# Patient Record
Sex: Male | Born: 1965 | Hispanic: No | Marital: Single | Smoking: Current every day smoker
Health system: Southern US, Community
[De-identification: ages and names within clinical notes are randomized; demographics above are authoritative.]

## PROBLEM LIST (undated history)

## (undated) DIAGNOSIS — I219 Acute myocardial infarction, unspecified: Secondary | ICD-10-CM

## (undated) DIAGNOSIS — I469 Cardiac arrest, cause unspecified: Secondary | ICD-10-CM

---

## 2017-11-02 ENCOUNTER — Encounter (HOSPITAL_COMMUNITY): Payer: Self-pay

## 2017-11-02 ENCOUNTER — Other Ambulatory Visit: Payer: Self-pay

## 2017-11-02 ENCOUNTER — Inpatient Hospital Stay (HOSPITAL_COMMUNITY): Payer: Medicaid Other

## 2017-11-02 ENCOUNTER — Inpatient Hospital Stay (HOSPITAL_COMMUNITY)
Admission: EM | Admit: 2017-11-02 | Discharge: 2017-11-20 | DRG: 270 | Disposition: E | Payer: Medicaid Other | Attending: Critical Care Medicine | Admitting: Critical Care Medicine

## 2017-11-02 ENCOUNTER — Inpatient Hospital Stay (HOSPITAL_COMMUNITY): Admission: EM | Disposition: E | Payer: Self-pay | Source: Home / Self Care | Attending: Critical Care Medicine

## 2017-11-02 DIAGNOSIS — I472 Ventricular tachycardia: Secondary | ICD-10-CM | POA: Diagnosis present

## 2017-11-02 DIAGNOSIS — N170 Acute kidney failure with tubular necrosis: Secondary | ICD-10-CM | POA: Diagnosis present

## 2017-11-02 DIAGNOSIS — F1290 Cannabis use, unspecified, uncomplicated: Secondary | ICD-10-CM | POA: Diagnosis present

## 2017-11-02 DIAGNOSIS — R0489 Hemorrhage from other sites in respiratory passages: Secondary | ICD-10-CM | POA: Diagnosis present

## 2017-11-02 DIAGNOSIS — I4901 Ventricular fibrillation: Secondary | ICD-10-CM | POA: Diagnosis present

## 2017-11-02 DIAGNOSIS — J9602 Acute respiratory failure with hypercapnia: Secondary | ICD-10-CM | POA: Diagnosis present

## 2017-11-02 DIAGNOSIS — J9601 Acute respiratory failure with hypoxia: Secondary | ICD-10-CM | POA: Diagnosis present

## 2017-11-02 DIAGNOSIS — R570 Cardiogenic shock: Secondary | ICD-10-CM | POA: Diagnosis present

## 2017-11-02 DIAGNOSIS — F17210 Nicotine dependence, cigarettes, uncomplicated: Secondary | ICD-10-CM | POA: Diagnosis present

## 2017-11-02 DIAGNOSIS — Z0189 Encounter for other specified special examinations: Secondary | ICD-10-CM

## 2017-11-02 DIAGNOSIS — E876 Hypokalemia: Secondary | ICD-10-CM | POA: Diagnosis present

## 2017-11-02 DIAGNOSIS — Z515 Encounter for palliative care: Secondary | ICD-10-CM | POA: Diagnosis present

## 2017-11-02 DIAGNOSIS — Z978 Presence of other specified devices: Secondary | ICD-10-CM

## 2017-11-02 DIAGNOSIS — Z9889 Other specified postprocedural states: Secondary | ICD-10-CM

## 2017-11-02 DIAGNOSIS — I959 Hypotension, unspecified: Secondary | ICD-10-CM | POA: Diagnosis not present

## 2017-11-02 DIAGNOSIS — G253 Myoclonus: Secondary | ICD-10-CM | POA: Diagnosis present

## 2017-11-02 DIAGNOSIS — J939 Pneumothorax, unspecified: Secondary | ICD-10-CM | POA: Diagnosis present

## 2017-11-02 DIAGNOSIS — D696 Thrombocytopenia, unspecified: Secondary | ICD-10-CM | POA: Diagnosis present

## 2017-11-02 DIAGNOSIS — R569 Unspecified convulsions: Secondary | ICD-10-CM

## 2017-11-02 DIAGNOSIS — I2111 ST elevation (STEMI) myocardial infarction involving right coronary artery: Secondary | ICD-10-CM | POA: Diagnosis not present

## 2017-11-02 DIAGNOSIS — N179 Acute kidney failure, unspecified: Secondary | ICD-10-CM | POA: Diagnosis not present

## 2017-11-02 DIAGNOSIS — R4189 Other symptoms and signs involving cognitive functions and awareness: Secondary | ICD-10-CM | POA: Diagnosis not present

## 2017-11-02 DIAGNOSIS — G9382 Brain death: Secondary | ICD-10-CM | POA: Diagnosis not present

## 2017-11-02 DIAGNOSIS — G931 Anoxic brain damage, not elsewhere classified: Secondary | ICD-10-CM | POA: Diagnosis present

## 2017-11-02 DIAGNOSIS — I2119 ST elevation (STEMI) myocardial infarction involving other coronary artery of inferior wall: Secondary | ICD-10-CM | POA: Diagnosis present

## 2017-11-02 DIAGNOSIS — Z955 Presence of coronary angioplasty implant and graft: Secondary | ICD-10-CM | POA: Diagnosis not present

## 2017-11-02 DIAGNOSIS — I469 Cardiac arrest, cause unspecified: Secondary | ICD-10-CM | POA: Diagnosis not present

## 2017-11-02 DIAGNOSIS — E874 Mixed disorder of acid-base balance: Secondary | ICD-10-CM | POA: Diagnosis present

## 2017-11-02 DIAGNOSIS — J9600 Acute respiratory failure, unspecified whether with hypoxia or hypercapnia: Secondary | ICD-10-CM

## 2017-11-02 DIAGNOSIS — I2510 Atherosclerotic heart disease of native coronary artery without angina pectoris: Secondary | ICD-10-CM | POA: Diagnosis present

## 2017-11-02 DIAGNOSIS — K7200 Acute and subacute hepatic failure without coma: Secondary | ICD-10-CM | POA: Diagnosis present

## 2017-11-02 DIAGNOSIS — E872 Acidosis, unspecified: Secondary | ICD-10-CM | POA: Clinically undetermined

## 2017-11-02 DIAGNOSIS — L8990 Pressure ulcer of unspecified site, unspecified stage: Secondary | ICD-10-CM

## 2017-11-02 DIAGNOSIS — Z66 Do not resuscitate: Secondary | ICD-10-CM | POA: Diagnosis present

## 2017-11-02 DIAGNOSIS — J690 Pneumonitis due to inhalation of food and vomit: Secondary | ICD-10-CM | POA: Diagnosis present

## 2017-11-02 DIAGNOSIS — R739 Hyperglycemia, unspecified: Secondary | ICD-10-CM | POA: Diagnosis not present

## 2017-11-02 DIAGNOSIS — Z4682 Encounter for fitting and adjustment of non-vascular catheter: Secondary | ICD-10-CM

## 2017-11-02 DIAGNOSIS — I219 Acute myocardial infarction, unspecified: Secondary | ICD-10-CM

## 2017-11-02 HISTORY — DX: Cardiac arrest, cause unspecified: I46.9

## 2017-11-02 HISTORY — DX: Acute myocardial infarction, unspecified: I21.9

## 2017-11-02 HISTORY — PX: CORONARY/GRAFT ACUTE MI REVASCULARIZATION: CATH118305

## 2017-11-02 HISTORY — PX: RIGHT HEART CATH: CATH118263

## 2017-11-02 HISTORY — PX: IABP INSERTION: CATH118242

## 2017-11-02 HISTORY — PX: LEFT HEART CATH AND CORONARY ANGIOGRAPHY: CATH118249

## 2017-11-02 LAB — PROTIME-INR
INR: 1.45
PROTHROMBIN TIME: 17.5 s — AB (ref 11.4–15.2)

## 2017-11-02 LAB — RAPID URINE DRUG SCREEN, HOSP PERFORMED
Amphetamines: NOT DETECTED
BARBITURATES: NOT DETECTED
Benzodiazepines: NOT DETECTED
Cocaine: NOT DETECTED
Opiates: NOT DETECTED
TETRAHYDROCANNABINOL: POSITIVE — AB

## 2017-11-02 LAB — URINALYSIS, ROUTINE W REFLEX MICROSCOPIC
Bilirubin Urine: NEGATIVE
Glucose, UA: 50 mg/dL — AB
KETONES UR: 5 mg/dL — AB
Leukocytes, UA: NEGATIVE
NITRITE: NEGATIVE
PH: 5 (ref 5.0–8.0)
Protein, ur: >=300 mg/dL — AB

## 2017-11-02 LAB — CBC
HCT: 44.8 % (ref 39.0–52.0)
Hemoglobin: 14.9 g/dL (ref 13.0–17.0)
MCH: 29.3 pg (ref 26.0–34.0)
MCHC: 33.3 g/dL (ref 30.0–36.0)
MCV: 88 fL (ref 78.0–100.0)
Platelets: 228 10*3/uL (ref 150–400)
RBC: 5.09 MIL/uL (ref 4.22–5.81)
RDW: 12.6 % (ref 11.5–15.5)
WBC: 33.5 10*3/uL — ABNORMAL HIGH (ref 4.0–10.5)

## 2017-11-02 LAB — LIPID PANEL
Cholesterol: 104 mg/dL (ref 0–200)
HDL: 44 mg/dL (ref >40)
LDL CALC: 42 mg/dL (ref 0–99)
Total CHOL/HDL Ratio: 2.4 RATIO
Triglycerides: 89 mg/dL (ref <150)
VLDL: 18 mg/dL (ref 0–40)

## 2017-11-02 LAB — COMPREHENSIVE METABOLIC PANEL
ALBUMIN: 3.3 g/dL — AB (ref 3.5–5.0)
ALK PHOS: 67 U/L (ref 38–126)
ALT: 134 U/L — AB (ref 17–63)
ANION GAP: 21 — AB (ref 5–15)
AST: 294 U/L — ABNORMAL HIGH (ref 15–41)
BUN: 14 mg/dL (ref 6–20)
CALCIUM: 7.7 mg/dL — AB (ref 8.9–10.3)
CHLORIDE: 103 mmol/L (ref 101–111)
CO2: 13 mmol/L — AB (ref 22–32)
Creatinine, Ser: 1.65 mg/dL — ABNORMAL HIGH (ref 0.61–1.24)
GFR calc Af Amer: 54 mL/min — ABNORMAL LOW (ref >60)
GFR calc non Af Amer: 47 mL/min — ABNORMAL LOW (ref >60)
GLUCOSE: 388 mg/dL — AB (ref 65–99)
Potassium: 3.5 mmol/L (ref 3.5–5.1)
SODIUM: 137 mmol/L (ref 135–145)
Total Bilirubin: 1.5 mg/dL — ABNORMAL HIGH (ref 0.3–1.2)
Total Protein: 5.4 g/dL — ABNORMAL LOW (ref 6.5–8.1)

## 2017-11-02 LAB — HEMOGLOBIN A1C
HEMOGLOBIN A1C: 5.5 % (ref 4.8–5.6)
Mean Plasma Glucose: 111.15 mg/dL

## 2017-11-02 LAB — GLUCOSE, CAPILLARY: Glucose-Capillary: 238 mg/dL — ABNORMAL HIGH (ref 65–99)

## 2017-11-02 LAB — TROPONIN I: TROPONIN I: 39.49 ng/mL — AB (ref <0.03)

## 2017-11-02 LAB — APTT: aPTT: 35 seconds (ref 24–36)

## 2017-11-02 SURGERY — CORONARY/GRAFT ACUTE MI REVASCULARIZATION
Anesthesia: LOCAL

## 2017-11-02 MED ORDER — HEPARIN (PORCINE) IN NACL 1000-0.9 UT/500ML-% IV SOLN
INTRAVENOUS | Status: AC
  Filled 2017-11-02: qty 1000

## 2017-11-02 MED ORDER — INSULIN ASPART 100 UNIT/ML ~~LOC~~ SOLN
2.0000 [IU] | SUBCUTANEOUS | Status: DC
  Administered 2017-11-03: 4 [IU] via SUBCUTANEOUS
  Administered 2017-11-03 (×2): 6 [IU] via SUBCUTANEOUS
  Administered 2017-11-03: 4 [IU] via SUBCUTANEOUS
  Administered 2017-11-04 – 2017-11-05 (×3): 2 [IU] via SUBCUTANEOUS

## 2017-11-02 MED ORDER — SODIUM CHLORIDE 0.9 % IV SOLN
250.0000 mL | INTRAVENOUS | Status: DC | PRN
Start: 2017-11-02 — End: 2017-11-06

## 2017-11-02 MED ORDER — HEPARIN (PORCINE) IN NACL 2-0.9 UNITS/ML
INTRAMUSCULAR | Status: AC | PRN
  Administered 2017-11-02 (×3): 500 mL

## 2017-11-02 MED ORDER — METOPROLOL TARTRATE 12.5 MG HALF TABLET
12.5000 mg | ORAL_TABLET | Freq: Two times a day (BID) | ORAL | Status: DC
  Administered 2017-11-05: 12.5 mg via ORAL
  Filled 2017-11-02 (×2): qty 1

## 2017-11-02 MED ORDER — SODIUM CHLORIDE 0.9% FLUSH
3.0000 mL | Freq: Two times a day (BID) | INTRAVENOUS | Status: DC
  Administered 2017-11-03 – 2017-11-05 (×7): 3 mL via INTRAVENOUS

## 2017-11-02 MED ORDER — BIVALIRUDIN TRIFLUOROACETATE 250 MG IV SOLR
INTRAVENOUS | Status: AC
  Filled 2017-11-02: qty 250

## 2017-11-02 MED ORDER — HYDRALAZINE HCL 20 MG/ML IJ SOLN: 5.0000 mg | INTRAMUSCULAR | Status: AC | PRN

## 2017-11-02 MED ORDER — ONDANSETRON HCL 4 MG/2ML IJ SOLN
4.0000 mg | Freq: Four times a day (QID) | INTRAMUSCULAR | Status: DC | PRN
Start: 2017-11-02 — End: 2017-11-06

## 2017-11-02 MED ORDER — TICAGRELOR 90 MG PO TABS
90.0000 mg | ORAL_TABLET | Freq: Two times a day (BID) | ORAL | Status: DC
  Administered 2017-11-02 – 2017-11-05 (×8): 90 mg via ORAL
  Filled 2017-11-02 (×7): qty 1

## 2017-11-02 MED ORDER — ATORVASTATIN CALCIUM 80 MG PO TABS: 80.0000 mg | ORAL_TABLET | Freq: Every day | ORAL | Status: DC

## 2017-11-02 MED ORDER — LIDOCAINE HCL (PF) 1 % IJ SOLN
INTRAMUSCULAR | Status: AC
  Filled 2017-11-02: qty 5

## 2017-11-02 MED ORDER — SODIUM CHLORIDE 0.9 % IV SOLN
INTRAVENOUS | Status: AC | PRN
  Administered 2017-11-02: 4 ug/kg/min via INTRAVENOUS

## 2017-11-02 MED ORDER — HEPARIN SODIUM (PORCINE) 1000 UNIT/ML IJ SOLN
INTRAMUSCULAR | Status: AC
  Filled 2017-11-02: qty 1

## 2017-11-02 MED ORDER — SODIUM CHLORIDE 0.9 % IV SOLN
INTRAVENOUS | Status: DC | PRN
  Administered 2017-11-02: 1.75 mg/kg/h via INTRAVENOUS

## 2017-11-02 MED ORDER — FENTANYL CITRATE (PF) 100 MCG/2ML IJ SOLN: 100.0000 ug | INTRAMUSCULAR | Status: DC | PRN

## 2017-11-02 MED ORDER — FENTANYL CITRATE (PF) 100 MCG/2ML IJ SOLN
100.0000 ug | INTRAMUSCULAR | Status: DC | PRN
  Administered 2017-11-03 – 2017-11-04 (×2): 100 ug via INTRAVENOUS
  Filled 2017-11-02 (×3): qty 2

## 2017-11-02 MED ORDER — AMIODARONE HCL IN DEXTROSE 360-4.14 MG/200ML-% IV SOLN
30.0000 mg/h | INTRAVENOUS | Status: DC
  Administered 2017-11-03 – 2017-11-05 (×6): 30 mg/h via INTRAVENOUS
  Filled 2017-11-02 (×6): qty 200

## 2017-11-02 MED ORDER — SODIUM BICARBONATE 8.4 % IV SOLN
100.0000 meq | Freq: Once | INTRAVENOUS | Status: AC
  Administered 2017-11-02: 100 meq via INTRAVENOUS
  Filled 2017-11-02: qty 100

## 2017-11-02 MED ORDER — IOHEXOL 350 MG/ML SOLN
INTRAVENOUS | Status: DC | PRN
  Administered 2017-11-02: 120 mL via INTRA_ARTERIAL

## 2017-11-02 MED ORDER — AMIODARONE HCL IN DEXTROSE 360-4.14 MG/200ML-% IV SOLN
INTRAVENOUS | Status: AC | PRN
  Administered 2017-11-02: 60 mg/h via INTRAVENOUS

## 2017-11-02 MED ORDER — BIVALIRUDIN BOLUS VIA INFUSION - CUPID
INTRAVENOUS | Status: DC | PRN
  Administered 2017-11-02: 48.75 mg via INTRAVENOUS

## 2017-11-02 MED ORDER — AMIODARONE HCL IN DEXTROSE 360-4.14 MG/200ML-% IV SOLN
60.0000 mg/h | INTRAVENOUS | Status: AC
  Administered 2017-11-03: 60 mg/h via INTRAVENOUS

## 2017-11-02 MED ORDER — ACETAMINOPHEN 325 MG PO TABS: 650.0000 mg | ORAL_TABLET | ORAL | Status: DC | PRN

## 2017-11-02 MED ORDER — PIPERACILLIN-TAZOBACTAM 3.375 G IVPB
3.3750 g | Freq: Three times a day (TID) | INTRAVENOUS | Status: DC
Start: 2017-11-02 — End: 2017-11-04
  Administered 2017-11-02 – 2017-11-04 (×6): 3.375 g via INTRAVENOUS
  Filled 2017-11-02 (×7): qty 50

## 2017-11-02 MED ORDER — SODIUM CHLORIDE 0.9 % IV SOLN
INTRAVENOUS | Status: DC
  Administered 2017-11-02: 23:00:00 via INTRAVENOUS

## 2017-11-02 MED ORDER — LABETALOL HCL 5 MG/ML IV SOLN: 10.0000 mg | INTRAVENOUS | Status: AC | PRN

## 2017-11-02 MED ORDER — ADENOSINE (DIAGNOSTIC) FOR INTRACORONARY USE
INTRAVENOUS | Status: DC | PRN
  Administered 2017-11-02 (×2): 60 ug via INTRACORONARY

## 2017-11-02 MED ORDER — POTASSIUM CHLORIDE 10 MEQ/100ML IV SOLN
10.0000 meq | INTRAVENOUS | Status: AC
  Administered 2017-11-02 – 2017-11-03 (×5): 10 meq via INTRAVENOUS
  Filled 2017-11-02 (×3): qty 100

## 2017-11-02 MED ORDER — CANGRELOR TETRASODIUM 50 MG IV SOLR
INTRAVENOUS | Status: AC
  Filled 2017-11-02: qty 50

## 2017-11-02 MED ORDER — VERAPAMIL HCL 2.5 MG/ML IV SOLN
INTRAVENOUS | Status: AC
Start: 2017-11-02 — End: ?
  Filled 2017-11-02: qty 2

## 2017-11-02 MED ORDER — SODIUM CHLORIDE 0.9 % IV SOLN
250.0000 mL | INTRAVENOUS | Status: DC | PRN
  Administered 2017-11-04: 10 mL via INTRAVENOUS

## 2017-11-02 MED ORDER — SODIUM CHLORIDE 0.9 % IV SOLN
2000.0000 mg | INTRAVENOUS | Status: AC
  Administered 2017-11-02: 2000 mg via INTRAVENOUS
  Filled 2017-11-02: qty 20

## 2017-11-02 MED ORDER — SODIUM CHLORIDE 0.9% FLUSH: 3.0000 mL | INTRAVENOUS | Status: DC | PRN

## 2017-11-02 MED ORDER — ASPIRIN 81 MG PO CHEW
81.0000 mg | CHEWABLE_TABLET | Freq: Every day | ORAL | Status: DC
  Administered 2017-11-03 – 2017-11-05 (×3): 81 mg via ORAL
  Filled 2017-11-02 (×3): qty 1

## 2017-11-02 MED ORDER — LIDOCAINE HCL (PF) 1 % IJ SOLN
INTRAMUSCULAR | Status: DC | PRN
  Administered 2017-11-02: 15 mL

## 2017-11-02 MED ORDER — FAMOTIDINE IN NACL 20-0.9 MG/50ML-% IV SOLN
20.0000 mg | Freq: Two times a day (BID) | INTRAVENOUS | Status: DC
  Administered 2017-11-02 – 2017-11-04 (×4): 20 mg via INTRAVENOUS
  Filled 2017-11-02 (×5): qty 50

## 2017-11-02 MED ORDER — CANGRELOR BOLUS VIA INFUSION
INTRAVENOUS | Status: DC | PRN
  Administered 2017-11-02: 1950 ug via INTRAVENOUS

## 2017-11-02 MED ORDER — NITROGLYCERIN 1 MG/10 ML FOR IR/CATH LAB
INTRA_ARTERIAL | Status: DC | PRN
  Administered 2017-11-02: 200 ug via INTRACORONARY

## 2017-11-02 MED ORDER — PROPOFOL 1000 MG/100ML IV EMUL
0.0000 ug/kg/min | INTRAVENOUS | Status: DC
  Administered 2017-11-02: 10 ug/kg/min via INTRAVENOUS
  Administered 2017-11-03 – 2017-11-04 (×2): 30 ug/kg/min via INTRAVENOUS
  Filled 2017-11-02 (×4): qty 100

## 2017-11-02 MED ORDER — SODIUM CHLORIDE 0.9 % IV SOLN
4.0000 ug/kg/min | INTRAVENOUS | Status: DC
  Administered 2017-11-02 – 2017-11-03 (×2): 4 ug/kg/min via INTRAVENOUS
  Filled 2017-11-02 (×2): qty 50

## 2017-11-02 MED ORDER — NOREPINEPHRINE BITARTRATE 1 MG/ML IV SOLN
0.0000 ug/min | INTRAVENOUS | Status: DC
  Administered 2017-11-02: 8 ug/min via INTRAVENOUS
  Filled 2017-11-02: qty 4

## 2017-11-02 MED ORDER — PROPOFOL 10 MG/ML IV BOLUS
INTRAVENOUS | Status: DC | PRN
  Administered 2017-11-02: 650 ug via INTRAVENOUS

## 2017-11-02 MED ORDER — LIDOCAINE HCL (PF) 1 % IJ SOLN
INTRAMUSCULAR | Status: AC
  Filled 2017-11-02: qty 30

## 2017-11-02 MED FILL — Medication: Qty: 1 | Status: AC

## 2017-11-02 SURGICAL SUPPLY — 25 items
BALLN IABP SENSA PLUS 8F 50CC (BALLOONS) ×2
BALLN SAPPHIRE 2.5X15 (BALLOONS) ×2
BALLOON IABP SENS PLUS 8F 50CC (BALLOONS) ×1 IMPLANT
BALLOON SAPPHIRE 2.5X15 (BALLOONS) ×1 IMPLANT
CATH EXTRAC PRONTO 5.5F 138CM (CATHETERS) ×2 IMPLANT
CATH INFINITI 5FR MULTPACK ANG (CATHETERS) ×2 IMPLANT
CATH LAUNCHER 6FR JR4 (CATHETERS) ×2 IMPLANT
CATH SWAN GANZ 7F STRAIGHT (CATHETERS) ×2 IMPLANT
GLIDESHEATH SLEND SS 6F .021 (SHEATH) ×2 IMPLANT
GUIDEWIRE INQWIRE 1.5J.035X260 (WIRE) ×1 IMPLANT
INQWIRE 1.5J .035X260CM (WIRE) ×2
KIT ENCORE 26 ADVANTAGE (KITS) ×2 IMPLANT
KIT HEART LEFT (KITS) ×2 IMPLANT
KIT HEMO VALVE WATCHDOG (MISCELLANEOUS) ×2 IMPLANT
PACK CARDIAC CATHETERIZATION (CUSTOM PROCEDURE TRAY) ×2 IMPLANT
SHEATH AVANTI 11CM 7FR (SHEATH) ×2 IMPLANT
SHEATH PINNACLE 6F 10CM (SHEATH) ×2 IMPLANT
SLEEVE REPOSITIONING LENGTH 30 (MISCELLANEOUS) ×2 IMPLANT
STENT SYNERGY DES 3.5X38 (Permanent Stent) ×2 IMPLANT
STENT SYNERGY DES 3X38 (Permanent Stent) ×2 IMPLANT
TRANSDUCER W/STOPCOCK (MISCELLANEOUS) ×4 IMPLANT
TUBING CIL FLEX 10 FLL-RA (TUBING) ×2 IMPLANT
WIRE ASAHI PROWATER 180CM (WIRE) ×2 IMPLANT
WIRE EMERALD 3MM-J .035X150CM (WIRE) ×2 IMPLANT
WIRE FIGHTER CROSSING 190CM (WIRE) ×2 IMPLANT

## 2017-11-02 NOTE — Code Documentation (Signed)
Pulses back, ST elevation noticed on EKG. Amiodarone and Propofol ordered by MD

## 2017-11-02 NOTE — ED Provider Notes (Signed)
[facility] EMERGENCY DEPARTMENT Provider Note   CSN: [id number] Arrival date & time: 11/07/2017  1828     History   Chief Complaint Chief Complaint  Patient presents with  . Cardiac Arrest    HPI Scott Mcclure is a 52 y.o. male.  5 caveat secondary to acuity condition.  About an hour prior to arrival patient experienced substernal chest pain and then collapsed at home.  By standard CPR begun.  Fire department shocked multiple times and began CPR with [name].  EMS gave him epi and amiodarone bolus for V. tach V. fib.  Patient had ROSC during transport and prehospital EKG demonstrates a large ST elevation inferior MI with reciprocal depression.  But 2 minutes prior to arrival at [facility] patient went back into V. tach and lost pulses and CPR was resumed.  Patient arrives with CPR ongoing.  HPI  History reviewed. No pertinent past medical history.  There are no active problems to display for this patient.   History reviewed. No pertinent surgical history.      Home Medications    Prior to Admission medications   Not on File    Family History History reviewed. No pertinent family history.  Social History Social History   Tobacco Use  . Smoking status: Not on file  Substance Use Topics  . Alcohol use: Not on file  . Drug use: Not on file     Allergies   Patient has no known allergies.   Review of Systems Review of Systems  Unable to perform ROS: Acuity of condition     Physical Exam Updated Vital Signs BP (!) 112/99   Pulse 88   Temp (!) 97.2 F (36.2 C) (Temporal)   Resp (!) 23   Wt 65 kg (143 lb 4.8 oz)   SpO2 99%   Physical Exam  Constitutional: He appears well-developed and well-nourished.  HENT:  Head: Normocephalic and atraumatic.  Right Ear: External ear normal.  Left Ear: External ear normal.  Orally intubated blood out of the ET tube.  Eyes:  Pupils mid and sluggish.  Equal.  Neck: No tracheal deviation present.    Cardiovascular:  [name] was taken off and patient has femoral pulses and by ultrasound has cardiac activity.  Pulmonary/Chest:  Patient is spontaneously breathing and being assisted by ambulance.  Full breath sounds bilaterally.  There is a anterior chest central defects as a pectus excavatum possibly related to CPR.  Abdominal: Soft. He exhibits no mass.  Genitourinary: Penis normal.  Musculoskeletal: Normal range of motion. He exhibits no deformity.  Neurological:  Patient is unresponsive but having spontaneous breathing.  He is moving his arms reaching for the ET tube.     ED Treatments / Results  Labs (all labs ordered are listed, but only abnormal results are displayed) Labs Reviewed  COMPREHENSIVE METABOLIC PANEL  LIPID PANEL  HEMOGLOBIN A1C  CBC  PROTIME-INR  APTT  TROPONIN I    EKG EKG Interpretation  Date/Time:  Tuesday 11-07-2017 18:29:14 EDT Ventricular Rate:  94 PR Interval:    QRS Duration: 165 QT Interval:  402 QTC Calculation: 503 R Axis:   60 Text Interpretation:  Sinus rhythm Nonspecific intraventricular conduction delay Probable inferior infarct, recent Abnrm T, consider ischemia, anterolateral lds Baseline wander in lead(s) I III aVL no prior available Confirmed by Meridee Score [phone]) on 07-Nov-2017 6:47:55 PM   Radiology No results found.  Procedures .Critical Care Performed by: Terrilee Files, MD Authorized by:  Terrilee Files, MD   Critical care provider statement:    Critical care time (minutes):  30   Critical care time was exclusive of:  Separately billable procedures and treating other patients   Critical care was necessary to treat or prevent imminent or life-threatening deterioration of the following conditions:  Cardiac failure   Critical care was time spent personally by me on the following activities:  Discussions with consultants, evaluation of patient's response to treatment, obtaining history from patient or surrogate,  examination of patient, ordering and performing treatments and interventions, pulse oximetry, re-evaluation of patient's condition and ventilator management   I assumed direction of critical care for this patient from another provider in my specialty: no   Comments:     Patient was in ED for 20+ minutes requiring my bedside presence as a peri-arrest unstable patient. Discussing with cardiologist and transfer to cath lab, documentation.    (including critical care time)  Medications Ordered in ED Medications - No data to display   Initial Impression / Assessment and Plan / ED Course  I have reviewed the triage vital signs and the nursing notes.  Pertinent labs & imaging results that were available during my care of the patient were reviewed by me and considered in my medical decision making (see chart for details).  Clinical Course as of Nov 03 1233  Tue Nov 02, 2017  1610 Patient with witnessed cardiac arrest and now has ongoing CPR.  On arrival the stop the Casselman and by cardiac ultrasound has cardiac movement and has peripheral pulses.  Interventional cardiology is here and asking that we get the patient up to the Cath Lab.  We are starting him on an amnio drip and propofol for sedation.  There is been some blood from the ET tube.  He still getting adequate volumes and with the acuity of the situation getting up to the Cath Lab was taken priority over any further manipulation of the tube.   [MB]    Clinical Course User Index [MB] Terrilee Files, MD     Final Clinical Impressions(s) / ED Diagnoses   Final diagnoses:  Cardiopulmonary arrest [facility])    ED Discharge Orders    None       Terrilee Files, MD 11/03/17 1240

## 2017-11-02 NOTE — ED Triage Notes (Signed)
Patient here by EMS for activated STEMI, patient collapsed in front of family.  Bystander CPR preformed.  Total of 40 min CPR given by EMS.  ROSC for 25 min then went into [facility]  Shocked total of 4 times by EMS. Initial rhythm VFIB. 6 of epi given 300 mg amiodarone IV push given by EMS.  CPR ongoing on arrival to ED.    CBG 305, last BP: 128/88, HR 100, intubated by EMS, sats 100%

## 2017-11-02 NOTE — ED Notes (Signed)
Several more family members taken to Consultation B.

## 2017-11-02 NOTE — H&P (Signed)
Cardiology Consultation:   Patient ID: Scott Mcclure; [id number]; [birth year]   Admit date: 2017-11-22 Date of Consult: Nov 22, 2017  Primary Care Provider: No primary care provider on file. Primary Cardiologist: No primary care provider on file. Scott Mcclure -new Primary Electrophysiologist:     Patient Profile:   Scott Mcclure is a 52 y.o. male with a hx of  who is being seen today for the evaluation of cardiac arrest at the request of [name], Dr. Charm Barges.  History of Present Illness:   Scott Mcclure hasd chest pain at home and collapsed in front of his family.  He required 4 shocks and approximately 40 minutes of CPR.  ECG showed inferior ST elevations so he is brought emergently to the cath lab.    History obtained from the EMS staff.  Patient intubated and unable to give history.  Amio drip started in the ER.   ER records show: "About an hour prior to arrival patient experienced substernal chest pain and then collapsed at home.  By standard CPR begun.  Fire department shocked multiple times and began CPR with Scott Mcclure.  EMS gave him epi and amiodarone bolus for V. tach V. fib.  Patient had ROSC during transport and prehospital EKG demonstrates a large ST elevation inferior MI with reciprocal depression.  But 2 minutes prior to arrival at [facility] patient went back into V. tach and lost pulses and CPR was resumed.  Patient arrives with CPR ongoing."    History reviewed. No pertinent past medical history.  History reviewed. No pertinent surgical history.     Inpatient Medications: Scheduled Meds:  Continuous Infusions:  PRN Meds:   Allergies:   No Known Allergies  Social History:   Social History   Socioeconomic History  . Marital status: Not on file    Spouse name: Not on file  . Number of children: Not on file  . Years of education: Not on file  . Highest education level: Not on file  Occupational History  . Not on file  Social Needs  . Financial resource strain: Not on file    . Food insecurity:    Worry: Not on file    Inability: Not on file  . Transportation needs:    Medical: Not on file    Non-medical: Not on file  Tobacco Use  . Smoking status: Not on file  Substance and Sexual Activity  . Alcohol use: Not on file  . Drug use: Not on file  . Sexual activity: Not on file  Lifestyle  . Physical activity:    Days per week: Not on file    Minutes per session: Not on file  . Stress: Not on file  Relationships  . Social connections:    Talks on phone: Not on file    Gets together: Not on file    Attends religious service: Not on file    Active member of club or organization: Not on file    Attends meetings of clubs or organizations: Not on file    Relationship status: Not on file  . Intimate partner violence:    Fear of current or ex partner: Not on file    Emotionally abused: Not on file    Physically abused: Not on file    Forced sexual activity: Not on file  Other Topics Concern  . Not on file  Social History Narrative  . Not on file    Family History:   History reviewed. No pertinent family history. unable to  obtain  ROS:  Please see the history of present illness.  Unable to obtain All other ROS reviewed and negative.     Physical Exam/Data:   Vitals:   10/31/2017 1831 11/11/2017 1836 11/18/2017 1839  BP: (!) 112/99    Pulse: 88    Resp: (!) 23    Temp:   (!) 97.2 F (36.2 C)  TempSrc:   Temporal  SpO2: 99%    Weight:  143 lb 4.8 oz (65 kg)    No intake or output data in the 24 hours ending 11/09/2017 1846 Filed Weights   10/29/2017 1836  Weight: 143 lb 4.8 oz (65 kg)   There is no height or weight on file to calculate BMI.  General:  intubated HEENT: normal Lymph: no adenopathy Neck: no JVD Endocrine:  No thryomegaly Vascular: No carotid bruits; FA pulses 2+ bilaterally, palpable radial pulse  Cardiac:  normal S1, S2; RRR; no murmur  Lungs:  clear to auscultation bilaterally, no wheezing, rhonchi or rales  Abd: soft,  nontender, no hepatomegaly  Ext: no edema Musculoskeletal:  No deformities, BUE and BLE strength normal and equal Skin: warm and dry  Neuro:  CNs 2-12 intact, no focal abnormalities noted Psych:  Normal affect   EKG:  The EKG was personally reviewed and demonstrates:  NSR inferior ST elevation, widened QRS   Relevant CV Studies: N/A  Laboratory Data:  ChemistryNo results for input(s): NA, K, CL, CO2, GLUCOSE, BUN, CREATININE, CALCIUM, GFRNONAA, GFRAA, ANIONGAP in the last 168 hours.  No results for input(s): PROT, ALBUMIN, AST, ALT, ALKPHOS, BILITOT in the last 168 hours. HematologyNo results for input(s): WBC, RBC, HGB, HCT, MCV, MCH, MCHC, RDW, PLT in the last 168 hours. Cardiac EnzymesNo results for input(s): TROPONINI in the last 168 hours. No results for input(s): TROPIPOC in the last 168 hours.  BNPNo results for input(s): BNP, PROBNP in the last 168 hours.  DDimer No results for input(s): DDIMER in the last 168 hours.  Radiology/Studies:  No results found.  Assessment and Plan:   1. Acute inferior MI with cardiac arrest/VT: Recurrent arrest in the ED.  Epi drip stopped.Emergent cath done showing multivessel disease.  RCA stented as this was the acute vessel.  IABP placed.  RHC shows cardiogenic shock.  Patient with bleeding from ET tube during the procedure.   2. PCCM to decide on cooling the patient and vent management.   3. Cangrelor for 8 hours post Brilinta 180 administration.     For questions or updates, please contact [facility] Please consult [email or web address] for contact info under Cardiology/STEMI.   SignedLance Muss, MD  10/20/2017 6:46 PM

## 2017-11-02 NOTE — Progress Notes (Signed)
Pharmacy Antibiotic Note  Scott Mcclure is a 52 y.o. male admitted on 10/27/2017 with cardiac arrest with V-tach/V-fib, s/p Cath with IABP placement. CCM is also following, pt with pneumothorax, chest tube placed. Pharmacy has been consulted for zosyn dosing  Scr 1.65, est. crcl ~ 50 ml/min.  Plan: - Zosyn 3.375g IV Q 8 hrs  Weight: 143 lb 4.8 oz (65 kg)  Temp (24hrs), Avg:97.2 F (36.2 C), Min:97.2 F (36.2 C), Max:97.2 F (36.2 C)  Recent Labs  Lab 10/20/2017 1903  WBC 33.5*  CREATININE 1.65*    CrCl cannot be calculated (Unknown ideal weight.).    No Known Allergies  Antimicrobials this admission: Zosyn 5/14 >>   Dose adjustments this admission:   Microbiology results:   5/14 Sputum:    Thank you for allowing pharmacy to be a part of this patient's care.  Riki Rusk 10/29/2017 10:37 PM

## 2017-11-02 NOTE — Consult Note (Addendum)
Neurology Consultation Reason for Consult: Consult Note Referring Physician: Hammonds, K  CC: Unresponsiveness  History is obtained from:patient  HPI: Scott Mcclure is a 52 y.o. male who does not typically see doctors, who has been complaining of chest pain for the past year who presented with AFIB arrest due to CAD.   Following intervention(during which he received multiple stents) he was noted to have poor neuro status and having upward gaze deviation, therefore neurology has been consulted.    ROS: Unable to obtain due to altered mental status.   Past Medical History:  Diagnosis Date  . Cardiac arrest (HCC) 10/30/2017  . Myocardial infarct (HCC) 06-Nov-2017     FHx: Unable to assess secondary to patient's altered mental status.   Social History: Unable to assess secondary to patient's altered mental status.   Exam: Current vital signs: BP (!) 114/52   Pulse (!) 0   Temp (!) 97.2 F (36.2 C) (Temporal)   Resp (!) 0   Wt 65 kg (143 lb 4.8 oz)   SpO2 (!) 0%  Vital signs in last 24 hours: Temp:  [97.2 F (36.2 C)] 97.2 F (36.2 C) (05/14 1839) Pulse Rate:  [0-115] 0 (05/14 2101) Resp:  [0-32] 0 (05/14 2101) BP: (90-221)/(43-192) 114/52 (05/14 2031) SpO2:  [0 %-100 %] 0 % (05/14 2101) FiO2 (%):  [100 %] 100 % (05/14 2000) Weight:  [65 kg (143 lb 4.8 oz)] 65 kg (143 lb 4.8 oz) (05/14 1836)   Physical Exam  Constitutional: Appears well-developed and well-nourished.  Psych: unresponsive Eyes: No scleral injection HENT: ET tube in place Head: Normocephalic.  Cardiovascular: Normal rate and regular rhythm.  Respiratory: ventilated GI: Soft.  No distension. There is no tenderness.  Skin: WDI  Neuro: Mental Status: Patient is obtunded, eyes open intermittently but not clearly related to stimulus(Suspect associated with myoclonus).  Cranial Nerves: II: Does not blink to threat. Pupils are equal, round, and reactive to light.   III,IV, VI: Eyes are upwardly deviated.   V: VII: Corneals are absent X: cough intact Motor: No response to nox stim. Sensory: As above Deep Tendon Reflexes: Depressed throughout.  Cerebellar: Does not perform.   He has intermittent tremor of the jaw which appears myoclonic, though not definite generalized myoclonus as sometimes seen with anoxic injury.   I have reviewed labs in epic and the results pertinent to this consultation are: Elevated Cr at 1.6, elevated LFTs  Impression: 52 yo M with unresponsiveness following cardiac arrest. I am concerned with his movements for possible subtle convulsive status epilepticus.   Recommendations: 1)  Keppra 2g STAT 2) STAT EEG 3) CT head once able 4) Further recommendations following EEG.    This patient is critically ill and at significant risk of neurological worsening, death and care requires constant monitoring of vital signs, hemodynamics,respiratory and cardiac monitoring, neurological assessment, discussion with family, other specialists and medical decision making of high complexity. I spent 40 minutes of neurocritical care time  in the care of  this patient.  Ritta Slot, MD [facility] [phone]  If 7pm- 7am, please page neurology on call as listed in AMION. Nov 06, 2017  9:50 PM

## 2017-11-02 NOTE — Progress Notes (Signed)
Chaplain responded to page regarding this patient who was in active CPR on arrival.  Family was in waiting room of which I asked them to be placed in Consultation B.  I then went to Trauma B to see if I could let MD know where family was.  Greeted the family - brother and nephew and then patient's mother and sister's.  Patient was then rushed to Cath Lab and I shared with family that he was going to Cath Lab and I escorted them to 2 Heart waiting room.  Family was given drinks.  Prayer with and for them and they thanked me for all that had been done which was minimal on my part.  Will share with on call Chaplain whom should be paged should additional support be needed.  We are happy to provide care to patients, their families and staff.    12/02/2017 1855  Clinical Encounter Type  Visited With Family;Health care provider;Patient not available  Visit Type Initial;Spiritual support;Trauma  Spiritual Encounters  Spiritual Needs Emotional;Grief support;Prayer

## 2017-11-02 NOTE — Procedures (Signed)
Arterial Catheter Insertion Procedure Note Delaine Sauber [id number] 24-Feb-1966  Procedure: Insertion of Arterial Catheter  Indications: Blood pressure monitoring and Frequent blood sampling  Procedure Details Consent: Unable to obtain consent because of emergent medical necessity. Time Out: Verified patient identification, verified procedure, site/side was marked, verified correct patient position, special equipment/implants available, medications/allergies/relevent history reviewed, required imaging and test results available.  Performed  Maximum sterile technique was used including antiseptics, cap, gloves, gown, hand hygiene, mask and sheet. Skin prep: Chlorhexidine; local anesthetic administered 22 gauge catheter was inserted into right radial artery using the Seldinger technique. ULTRASOUND GUIDANCE USED: NO Evaluation Blood flow good; BP tracing good. Complications: No apparent complications.   Letta Median 11/11/2017 2310

## 2017-11-02 NOTE — ED Notes (Signed)
Amiodarone started at 53ml/hr and propofol started at 5mg /kg/hr (bolus of 1mg  given at this time)  Unable to scan due to transport to procedural area

## 2017-11-02 NOTE — Progress Notes (Signed)
Stat EEG completed, results pending  

## 2017-11-02 NOTE — Procedures (Signed)
Chest Tube Insertion Procedure Note  Indications:  Clinically significant Pneumothorax  Pre-operative Diagnosis: Pneumothorax  Post-operative Diagnosis: Pneumothorax  Procedure Details  Informed consent was obtained for the procedure, including sedation.  Risks of lung perforation, hemorrhage, arrhythmia, and adverse drug reaction were discussed.   After sterile skin prep, using standard technique, a Wayne pigtail chest tube was placed in the left 4th intercostal space anterior axillary line.   Findings: Gush of air on placement of chest tube. Pox immediately improved.   Estimated Blood Loss:  Minimal         Specimens:  None              Complications:  None; patient tolerated the procedure well.         Disposition: ICU - intubated and critically ill.         Condition: stable  Attending Attestation: I performed the procedure.

## 2017-11-02 NOTE — Progress Notes (Signed)
eLink Physician-Brief Progress Note Patient Name: Scott Mcclure DOB: 04-19-1966 MRN: [id number]   Date of Service  Nov 03, 2017  HPI/Events of Note  Multiple issues: 1. ABG on 100%/PC10/Rate 12/P 8 = 7.32/31/110/16, 2. CXR s/p L pigtail placement --> small residual L pneumothrorax and 3. Amiodarone orders needs to be renewed.   eICU Interventions  Will order: 1. Increase Pleuravac suction to -40. 2. Repeat ABG in AM. 3. Amiodarone order renewed.      Intervention Category Major Interventions: Respiratory failure - evaluation and management  [name] 11-03-2017, 11:42 PM

## 2017-11-02 NOTE — Consult Note (Signed)
PULMONARY / CRITICAL CARE MEDICINE   Name: Scott Mcclure MRN: [id number] DOB: 1966-02-15    ADMISSION DATE:  2017/11/11 CONSULTATION DATE: 2017/11/11  REFERRING MD: Dr Eldridge Dace  CHIEF COMPLAINT: STEMI, Cardiac arrest   HISTORY OF PRESENT ILLNESS:   51yoM with hx Tobacco abuse, presented after having a witnessed out-of-hospital cardiac arrest. According to family, patient has been c/o CP for the past 1 year. Then tonight he had a witnessed cardiac arrest, with bystander CPR initiated. EMS was called and on their arrival found patient to be in VFib. He required 40 minutes of CPR prior to achieving ROSC. On arrival to the ER patient had again lost a pulse, this time with VT cardiac arrest. He was shocked multiple times on his way to the cath lab. In the cath lab patient was noted to be having decorticate type posturing of his arms. During the cath patient began having copious bleeding from his ETT. Cath found diffuse coronary lesions, most severe in RCA, requiring multiple stents. IABP and Theone Murdoch placed. On arrival from cath lab to ICU patient noted to have eyes rolled up in his head and having questionable seizure vs myoclonus activity in his shoulders. Neurology called and is at bedside now.   PAST MEDICAL HISTORY :  He  has a past medical history of Cardiac arrest (HCC) (2017-11-11) and Myocardial infarct (HCC) (11-11-17).  PAST SURGICAL HISTORY: He  has no past surgical history on file.    No Known Allergies  No current facility-administered medications on file prior to encounter.    No current outpatient medications on file prior to encounter.   FAMILY HISTORY:  His has no family status information on file.   SOCIAL HISTORY: He  is an active smoker. Per patient's mother and brother, he smokes marijuana but no other illicit drugs. No Etoh use. Patient lives with his mother in her house; a friend also lives in the house with the patient's mother.   REVIEW OF SYSTEMS:   Review of  Systems  Unable to perform ROS: Critical illness   SUBJECTIVE:  Intubated and sedated   VITAL SIGNS: BP (!) 140/100   Pulse 94   Temp (!) 97.2 F (36.2 C) (Temporal)   Resp (!) 29   Wt 65 kg (143 lb 4.8 oz)   SpO2 100%   HEMODYNAMICS:   SBP 120's on no vasopressors  VENTILATOR SETTINGS: Vent Mode: PCV FiO2 (%):  [100 %] 100 % Set Rate:  [12 bmp-18 bmp] 12 bmp Vt Set:  [620 mL] 620 mL PEEP:  [5 cmH20] 5 cmH20 Plateau Pressure:  [12 cmH20-24 cmH20] 24 cmH20  INTAKE / OUTPUT: No intake/output data recorded.  PHYSICAL EXAMINATION: General: Thin adult male, intubated and sedated, critically ill Neuro: pupils pinpoint, eyes both deviated upward, no response to sternal rub or nail bed pressure. No spontaneous movements of any extremities at time of my exam. Intermittent muscle contractures in neck and shoulders that are occurring at regular intervals (?seizure). Reportedly had decorticate movements earlier this evening prior to cath. HEENT: Orally intubated. ETT with copious bright red blood being suctioned Cardiovascular: RRR no m/r/g Lungs: Rhonchi bilaterally, pectus excavatum chest wall deformity, Abrasions to mid-sternum from CPR.  Abdomen: Soft NTND Musculoskeletal: no LE edema  Skin: abrasions to sternal as described above.   LABS:  BMET Recent Labs  Lab Nov 11, 2017 1903  NA 137  K 3.5  CL 103  CO2 13*  BUN 14  CREATININE 1.65*  GLUCOSE 388*   Electrolytes Recent  Labs  Lab 11/01/2017 1903  CALCIUM 7.7*   CBC Recent Labs  Lab 10/23/2017 1903  WBC 33.5*  HGB 14.9  HCT 44.8  PLT 228   Coag's Recent Labs  Lab 11/17/2017 1903  APTT 35  INR 1.45   Sepsis Markers No results for input(s): LATICACIDVEN, PROCALCITON, O2SATVEN in the last 168 hours.  ABG No results for input(s): PHART, PCO2ART, PO2ART in the last 168 hours.  Liver Enzymes Recent Labs  Lab 11/15/2017 1903  AST PENDING  ALT 134*  ALKPHOS 67  BILITOT 1.5*  ALBUMIN 3.3*    Cardiac  Enzymes Recent Labs  Lab 10/30/2017 1903  TROPONINI 39.49*   Glucose No results for input(s): GLUCAP in the last 168 hours.  Imaging No results found.  STUDIES:  CXR (5/14): diffuse bilateral pulmonary infiltrates; moderate to large left-sided pneumothorax.  Head CT (5/14): pending EEG (5/14): pending   CULTURES: Sputum culture (5/14): pending   ANTIBIOTICS: Zosyn 5/14 >>  SIGNIFICANT EVENTS: 5/14: presented to ER s/p out-of-hospital VF/VT cardiac arrest, taken to cath lab with multiple DES placed, significant bleeding from ETT so not cooled, question of seizure. Neurology consulted. Left-sided pneumothorax presumably related to CPR; chest tube placed.   LINES/TUBES: IABP 5/14 >> Swan Ganz catheter 5/14 >> LLE IO 5/14 >> ETT 5/14 >> NG tube 5/14 >>  DISCUSSION: 51yoM with hx Tobacco abuse, presented to ER s/p out-of-hospital VF/VT cardiac arrest, taken to cath lab with multiple DES placed, significant bleeding from ETT so not cooled, question of seizure. Neurology consulted. Left-sided pneumothorax presumably related to CPR; chest tube placed.  ASSESSMENT / PLAN:   PULMONARY 1. Acute hypoxic and hypercapneic respiratory failure; Pulmonary hemorrhage; Pulmonary infiltrates (aspiration pneumonia vs aspirated blood); Pneumothorax: - CXR on my review shows a moderate-to-large left-sided pneumothorax, for which a left-sided pigtail chest tube was emergently placed. Repeat CXR on my review shows left lung is now re-inflated with Chest tube in appropriate position. Chest tube placed to suction.   - ABG (prior to chest tube) showed combination metabolic and respiratory acidosis. Patient at that time was on AC/PC setting but was breathing at RR 35, well over set rate, with minute ventilation of 23. Gave 2 amps Bicarb.  - Repeat ABG now post-chest tube placement; place A-line.  - CXR also shows diffuse pulmonary infiltrates bilaterally. Unclear if this represents aspirated blood in the  setting of acute pulmonary hemorrhage (suctioning copious frank blood from ETT) versus if this is aspiration pneumonia. Will obtain sputum culture, check lactate and procalcitonin. Start Zosyn. MRSA nares swab pending.    CARDIOVASCULAR 1. 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 Multiple DES's placed to RCA where heavier disease burden lay.  - IABP and Swan Ganz placed.  - TTE ordered. - Not initiating hypothermia protocol given the large volume of frank blood being suctioned from ETT. - Continue Cangrelor for 8 hours post Brilinta 180 mg via OG tube per Cardiology rec's - Start Statin  RENAL 1. AKI: - creatinine 1.65 with no known prior baseline; presumed this is all acute injury, likely related to ATN in setting of the prolonged arrest - foley catheter placed; UA obtained. Add urine lytes as well.  - monitor UOP; avoid nephrotoxic agents   2. Hypokalemia: - K 3.5; replete with KCL IV  GASTROINTESTINAL 1. Shock liver:  - transaminitis likely related to shock liver from the cardiac arrest. Continue to trend LFT's daily. -  NPO; GI prophylaxis.   HEMATOLOGIC No active issues   INFECTIOUS 1. Aspiration pneumonia: - check sputum culture, procal, lactate; start Zosyn  ENDOCRINE 1. Hyperglycemia: - no hx of DM; NPO; SSI  NEUROLOGIC 1. Questionable seizure vs Myoclonus; Anoxic Encephalopathy: - Neurology consulted - EEG ordered - Anti-epileptic drugs per Neurology.   FAMILY  - Updates: updated patient's mother and brother at bedside.  - Inter-disciplinary family meet or Palliative Care meeting due by: 11/08/17  60 minutes nonprocedural critical care time  Milana Obey, MD  Pulmonary and Critical Care Medicine [facility] Pager: [phone]  Nov 23, 2017, 8:55 PM

## 2017-11-02 NOTE — ED Notes (Signed)
Chaplain paged. [name] w/ chaplain services returned page immediately. She requested to put them in Consult B.

## 2017-11-02 NOTE — ED Notes (Signed)
Brother and other family member here, in waiting room.

## 2017-11-03 ENCOUNTER — Encounter (HOSPITAL_COMMUNITY): Payer: Self-pay | Admitting: Interventional Cardiology

## 2017-11-03 ENCOUNTER — Other Ambulatory Visit: Payer: Self-pay

## 2017-11-03 ENCOUNTER — Inpatient Hospital Stay (HOSPITAL_COMMUNITY): Payer: Medicaid Other

## 2017-11-03 DIAGNOSIS — R570 Cardiogenic shock: Secondary | ICD-10-CM

## 2017-11-03 DIAGNOSIS — G931 Anoxic brain damage, not elsewhere classified: Secondary | ICD-10-CM | POA: Diagnosis present

## 2017-11-03 DIAGNOSIS — Z955 Presence of coronary angioplasty implant and graft: Secondary | ICD-10-CM

## 2017-11-03 DIAGNOSIS — I469 Cardiac arrest, cause unspecified: Secondary | ICD-10-CM

## 2017-11-03 DIAGNOSIS — L8990 Pressure ulcer of unspecified site, unspecified stage: Secondary | ICD-10-CM

## 2017-11-03 DIAGNOSIS — I2510 Atherosclerotic heart disease of native coronary artery without angina pectoris: Secondary | ICD-10-CM

## 2017-11-03 LAB — POCT I-STAT, CHEM 8
BUN: 15 mg/dL (ref 6–20)
CALCIUM ION: 1.07 mmol/L — AB (ref 1.15–1.40)
Chloride: 101 mmol/L (ref 101–111)
Creatinine, Ser: 1.3 mg/dL — ABNORMAL HIGH (ref 0.61–1.24)
Glucose, Bld: 383 mg/dL — ABNORMAL HIGH (ref 65–99)
HEMATOCRIT: 45 % (ref 39.0–52.0)
HEMOGLOBIN: 15.3 g/dL (ref 13.0–17.0)
Potassium: 3.4 mmol/L — ABNORMAL LOW (ref 3.5–5.1)
SODIUM: 137 mmol/L (ref 135–145)
TCO2: 17 mmol/L — AB (ref 22–32)

## 2017-11-03 LAB — MAGNESIUM
Magnesium: 1.8 mg/dL (ref 1.7–2.4)
Magnesium: 1.8 mg/dL (ref 1.7–2.4)

## 2017-11-03 LAB — POCT I-STAT 3, ART BLOOD GAS (G3+)
ACID-BASE DEFICIT: 12 mmol/L — AB (ref 0.0–2.0)
ACID-BASE DEFICIT: 8 mmol/L — AB (ref 0.0–2.0)
Acid-base deficit: 9 mmol/L — ABNORMAL HIGH (ref 0.0–2.0)
Acid-base deficit: 11 mmol/L — ABNORMAL HIGH (ref 0.0–2.0)
BICARBONATE: 15.8 mmol/L — AB (ref 20.0–28.0)
BICARBONATE: 15.9 mmol/L — AB (ref 20.0–28.0)
BICARBONATE: 16.5 mmol/L — AB (ref 20.0–28.0)
Bicarbonate: 13.4 mmol/L — ABNORMAL LOW (ref 20.0–28.0)
O2 SAT: 93 %
O2 SAT: 97 %
O2 SAT: 98 %
O2 Saturation: 87 %
PCO2 ART: 31.3 mmHg — AB (ref 32.0–48.0)
PO2 ART: 110 mmHg — AB (ref 83.0–108.0)
PO2 ART: 62 mmHg — AB (ref 83.0–108.0)
PO2 ART: 71 mmHg — AB (ref 83.0–108.0)
Patient temperature: 36.3
Patient temperature: 37.8
Patient temperature: 98.6
TCO2: 14 mmol/L — AB (ref 22–32)
TCO2: 17 mmol/L — AB (ref 22–32)
TCO2: 17 mmol/L — AB (ref 22–32)
TCO2: 18 mmol/L — AB (ref 22–32)
pCO2 arterial: 39.2 mmHg (ref 32.0–48.0)
pCO2 arterial: 27.9 mmHg — ABNORMAL LOW (ref 32.0–48.0)
pCO2 arterial: 27.2 mmHg — ABNORMAL LOW (ref 32.0–48.0)
pH, Arterial: 7.286 — ABNORMAL LOW (ref 7.350–7.450)
pH, Arterial: 7.328 — ABNORMAL LOW (ref 7.350–7.450)
pH, Arterial: 7.377 (ref 7.350–7.450)
pH, Arterial: 7.213 — ABNORMAL LOW (ref 7.350–7.450)
pO2, Arterial: 95 mmHg (ref 83.0–108.0)

## 2017-11-03 LAB — COMPREHENSIVE METABOLIC PANEL
ALT: 148 U/L — ABNORMAL HIGH (ref 17–63)
AST: 582 U/L — ABNORMAL HIGH (ref 15–41)
Albumin: 2.9 g/dL — ABNORMAL LOW (ref 3.5–5.0)
Alkaline Phosphatase: 52 U/L (ref 38–126)
Anion gap: 17 — ABNORMAL HIGH (ref 5–15)
BILIRUBIN TOTAL: 1.7 mg/dL — AB (ref 0.3–1.2)
BUN: 16 mg/dL (ref 6–20)
CHLORIDE: 105 mmol/L (ref 101–111)
CO2: 16 mmol/L — AB (ref 22–32)
CREATININE: 1.95 mg/dL — AB (ref 0.61–1.24)
Calcium: 7.4 mg/dL — ABNORMAL LOW (ref 8.9–10.3)
GFR calc Af Amer: 44 mL/min — ABNORMAL LOW (ref >60)
GFR calc non Af Amer: 38 mL/min — ABNORMAL LOW (ref >60)
Glucose, Bld: 248 mg/dL — ABNORMAL HIGH (ref 65–99)
Potassium: 3.9 mmol/L (ref 3.5–5.1)
Sodium: 138 mmol/L (ref 135–145)
Total Protein: 4.6 g/dL — ABNORMAL LOW (ref 6.5–8.1)

## 2017-11-03 LAB — POCT I-STAT 3, VENOUS BLOOD GAS (G3P V)
Acid-base deficit: 11 mmol/L — ABNORMAL HIGH (ref 0.0–2.0)
Acid-base deficit: 11 mmol/L — ABNORMAL HIGH (ref 0.0–2.0)
Acid-base deficit: 10 mmol/L — ABNORMAL HIGH (ref 0.0–2.0)
BICARBONATE: 18.4 mmol/L — AB (ref 20.0–28.0)
Bicarbonate: 18.6 mmol/L — ABNORMAL LOW (ref 20.0–28.0)
Bicarbonate: 14.7 mmol/L — ABNORMAL LOW (ref 20.0–28.0)
O2 SAT: 46 %
O2 Saturation: 43 %
O2 Saturation: 94 %
PCO2 VEN: 57.2 mmHg (ref 44.0–60.0)
PCO2 VEN: 30.4 mmHg — AB (ref 44.0–60.0)
PH VEN: 7.12 — AB (ref 7.250–7.430)
PH VEN: 7.295 (ref 7.250–7.430)
PO2 VEN: 32 mmHg (ref 32.0–45.0)
PO2 VEN: 34 mmHg (ref 32.0–45.0)
PO2 VEN: 78 mmHg — AB (ref 32.0–45.0)
Patient temperature: 37.6
TCO2: 20 mmol/L — AB (ref 22–32)
TCO2: 20 mmol/L — ABNORMAL LOW (ref 22–32)
TCO2: 16 mmol/L — AB (ref 22–32)
pCO2, Ven: 56.7 mmHg (ref 44.0–60.0)
pH, Ven: 7.121 — CL (ref 7.250–7.430)

## 2017-11-03 LAB — CBC WITH DIFFERENTIAL/PLATELET
BASOS PCT: 0 %
Basophils Absolute: 0 10*3/uL (ref 0.0–0.1)
Eosinophils Absolute: 0 10*3/uL (ref 0.0–0.7)
Eosinophils Relative: 0 %
HEMATOCRIT: 39.1 % (ref 39.0–52.0)
Hemoglobin: 13.2 g/dL (ref 13.0–17.0)
Lymphocytes Relative: 4 %
Lymphs Abs: 1.6 10*3/uL (ref 0.7–4.0)
MCH: 29.1 pg (ref 26.0–34.0)
MCHC: 33.8 g/dL (ref 30.0–36.0)
MCV: 86.3 fL (ref 78.0–100.0)
MONOS PCT: 8 %
Monocytes Absolute: 3.1 10*3/uL — ABNORMAL HIGH (ref 0.1–1.0)
NEUTROS ABS: 34.6 10*3/uL — AB (ref 1.7–7.7)
Neutrophils Relative %: 88 %
Platelets: 226 10*3/uL (ref 150–400)
RBC: 4.53 MIL/uL (ref 4.22–5.81)
RDW: 12.9 % (ref 11.5–15.5)
WBC: 39.3 10*3/uL — ABNORMAL HIGH (ref 4.0–10.5)

## 2017-11-03 LAB — PROTIME-INR
INR: 1.89
Prothrombin Time: 21.5 seconds — ABNORMAL HIGH (ref 11.4–15.2)

## 2017-11-03 LAB — SODIUM, URINE, RANDOM: Sodium, Ur: 28 mmol/L

## 2017-11-03 LAB — GLUCOSE, CAPILLARY
Glucose-Capillary: 230 mg/dL — ABNORMAL HIGH (ref 65–99)
Glucose-Capillary: 159 mg/dL — ABNORMAL HIGH (ref 65–99)
Glucose-Capillary: 113 mg/dL — ABNORMAL HIGH (ref 65–99)
Glucose-Capillary: 105 mg/dL — ABNORMAL HIGH (ref 65–99)
Glucose-Capillary: 200 mg/dL — ABNORMAL HIGH (ref 65–99)

## 2017-11-03 LAB — BASIC METABOLIC PANEL
Anion gap: 20 — ABNORMAL HIGH (ref 5–15)
BUN: 18 mg/dL (ref 6–20)
CHLORIDE: 105 mmol/L (ref 101–111)
CO2: 16 mmol/L — AB (ref 22–32)
Calcium: 7.4 mg/dL — ABNORMAL LOW (ref 8.9–10.3)
Creatinine, Ser: 2.13 mg/dL — ABNORMAL HIGH (ref 0.61–1.24)
GFR calc non Af Amer: 34 mL/min — ABNORMAL LOW (ref >60)
GFR, EST AFRICAN AMERICAN: 40 mL/min — AB (ref >60)
GLUCOSE: 160 mg/dL — AB (ref 65–99)
Potassium: 4.1 mmol/L (ref 3.5–5.1)
Sodium: 141 mmol/L (ref 135–145)

## 2017-11-03 LAB — POCT ACTIVATED CLOTTING TIME
Activated Clotting Time: 142 seconds
Activated Clotting Time: 543 seconds

## 2017-11-03 LAB — CBC
HEMATOCRIT: 40.5 % (ref 39.0–52.0)
HEMOGLOBIN: 13.5 g/dL (ref 13.0–17.0)
MCH: 29.3 pg (ref 26.0–34.0)
MCHC: 33.3 g/dL (ref 30.0–36.0)
MCV: 88 fL (ref 78.0–100.0)
Platelets: 187 10*3/uL (ref 150–400)
RBC: 4.6 MIL/uL (ref 4.22–5.81)
RDW: 13.2 % (ref 11.5–15.5)
WBC: 39 10*3/uL — ABNORMAL HIGH (ref 4.0–10.5)

## 2017-11-03 LAB — HEPATIC FUNCTION PANEL
ALBUMIN: 2.8 g/dL — AB (ref 3.5–5.0)
ALK PHOS: 52 U/L (ref 38–126)
ALT: 312 U/L — AB (ref 17–63)
AST: 929 U/L — AB (ref 15–41)
BILIRUBIN INDIRECT: 2 mg/dL — AB (ref 0.3–0.9)
Bilirubin, Direct: 0.4 mg/dL (ref 0.1–0.5)
TOTAL PROTEIN: 4.8 g/dL — AB (ref 6.5–8.1)
Total Bilirubin: 2.4 mg/dL — ABNORMAL HIGH (ref 0.3–1.2)

## 2017-11-03 LAB — ECHOCARDIOGRAM COMPLETE
HEIGHTINCHES: 66 in
Weight: 2451.5152 oz

## 2017-11-03 LAB — PROCALCITONIN
Procalcitonin: 13.86 ng/mL
Procalcitonin: 2.59 ng/mL

## 2017-11-03 LAB — HEPARIN LEVEL (UNFRACTIONATED)

## 2017-11-03 LAB — TROPONIN I: Troponin I: >65 ng/mL (ref <0.03)

## 2017-11-03 LAB — LACTIC ACID, PLASMA
LACTIC ACID, VENOUS: 14.1 mmol/L — AB (ref 0.5–1.9)
Lactic Acid, Venous: 8.4 mmol/L (ref 0.5–1.9)

## 2017-11-03 LAB — HIV ANTIBODY (ROUTINE TESTING W REFLEX): HIV Screen 4th Generation wRfx: NONREACTIVE

## 2017-11-03 LAB — CREATININE, URINE, RANDOM: Creatinine, Urine: 185.88 mg/dL

## 2017-11-03 LAB — MRSA PCR SCREENING: MRSA BY PCR: NEGATIVE

## 2017-11-03 LAB — PHOSPHORUS
PHOSPHORUS: 4.6 mg/dL (ref 2.5–4.6)
Phosphorus: 4.3 mg/dL (ref 2.5–4.6)

## 2017-11-03 LAB — TRIGLYCERIDES: Triglycerides: 59 mg/dL (ref <150)

## 2017-11-03 MED ORDER — DEXTROSE 5 % IV SOLN
0.0000 ug/min | INTRAVENOUS | Status: DC
  Administered 2017-11-03: 40 ug/min via INTRAVENOUS
  Administered 2017-11-03: 25 ug/min via INTRAVENOUS
  Administered 2017-11-04: 30 ug/min via INTRAVENOUS
  Filled 2017-11-03 (×5): qty 16

## 2017-11-03 MED ORDER — HEPARIN (PORCINE) IN NACL 100-0.45 UNIT/ML-% IJ SOLN
1200.0000 [IU]/h | INTRAMUSCULAR | Status: DC
  Administered 2017-11-03: 800 [IU]/h via INTRAVENOUS
  Administered 2017-11-04: 1100 [IU]/h via INTRAVENOUS
  Filled 2017-11-03 (×3): qty 250

## 2017-11-03 MED ORDER — CHLORHEXIDINE GLUCONATE 0.12 % MT SOLN
15.0000 mL | Freq: Two times a day (BID) | OROMUCOSAL | Status: DC
  Administered 2017-11-03 – 2017-11-05 (×4): 15 mL via OROMUCOSAL

## 2017-11-03 MED ORDER — SODIUM BICARBONATE 8.4 % IV SOLN
INTRAVENOUS | Status: AC
  Administered 2017-11-03: 100 meq via INTRAVENOUS
  Filled 2017-11-03: qty 100

## 2017-11-03 MED ORDER — SODIUM CHLORIDE 0.9% FLUSH
10.0000 mL | Freq: Two times a day (BID) | INTRAVENOUS | Status: DC
  Administered 2017-11-03 – 2017-11-04 (×2): 10 mL

## 2017-11-03 MED ORDER — ORAL CARE MOUTH RINSE
15.0000 mL | OROMUCOSAL | Status: DC
Start: 2017-11-03 — End: 2017-11-06
  Administered 2017-11-03 – 2017-11-05 (×26): 15 mL via OROMUCOSAL

## 2017-11-03 MED ORDER — SODIUM CHLORIDE 0.9% FLUSH: 10.0000 mL | INTRAVENOUS | Status: DC | PRN

## 2017-11-03 MED ORDER — SODIUM BICARBONATE 8.4 % IV SOLN
100.0000 meq | Freq: Once | INTRAVENOUS | Status: AC
  Administered 2017-11-03: 100 meq via INTRAVENOUS

## 2017-11-03 MED ORDER — EPINEPHRINE PF 1 MG/ML IJ SOLN
0.5000 ug/min | INTRAVENOUS | Status: DC
  Administered 2017-11-03 (×2): 5 ug/min via INTRAVENOUS
  Filled 2017-11-03 (×3): qty 4

## 2017-11-03 MED ORDER — ORAL CARE MOUTH RINSE: 15.0000 mL | Freq: Four times a day (QID) | OROMUCOSAL | Status: DC

## 2017-11-03 MED ORDER — CHLORHEXIDINE GLUCONATE 0.12% ORAL RINSE (MEDLINE KIT)
15.0000 mL | Freq: Two times a day (BID) | OROMUCOSAL | Status: DC
  Administered 2017-11-03 – 2017-11-05 (×4): 15 mL via OROMUCOSAL

## 2017-11-03 MED ORDER — POTASSIUM CHLORIDE 10 MEQ/100ML IV SOLN
INTRAVENOUS | Status: AC
  Administered 2017-11-03: 10 meq via INTRAVENOUS
  Filled 2017-11-03: qty 100

## 2017-11-03 MED ORDER — CHLORHEXIDINE GLUCONATE CLOTH 2 % EX PADS
6.0000 | MEDICATED_PAD | Freq: Every day | CUTANEOUS | Status: DC
  Administered 2017-11-05: 6 via TOPICAL

## 2017-11-03 MED ORDER — TICAGRELOR 90 MG PO TABS
90.0000 mg | ORAL_TABLET | Freq: Once | ORAL | Status: AC
  Administered 2017-11-03: 90 mg via ORAL
  Filled 2017-11-03: qty 1

## 2017-11-03 MED ORDER — SODIUM BICARBONATE 8.4 % IV SOLN
INTRAVENOUS | Status: DC
  Administered 2017-11-03 – 2017-11-04 (×3): via INTRAVENOUS
  Filled 2017-11-03 (×5): qty 850

## 2017-11-03 MED ORDER — PNEUMOCOCCAL VAC POLYVALENT 25 MCG/0.5ML IJ INJ: 0.5000 mL | INJECTION | INTRAMUSCULAR | Status: DC | PRN

## 2017-11-03 MED ORDER — VASOPRESSIN 20 UNIT/ML IV SOLN
0.0300 [IU]/min | INTRAVENOUS | Status: DC
  Administered 2017-11-03: 0.03 [IU]/min via INTRAVENOUS
  Filled 2017-11-03: qty 2

## 2017-11-03 MED FILL — Verapamil HCl IV Soln 2.5 MG/ML: INTRAVENOUS | Qty: 2 | Status: AC

## 2017-11-03 MED FILL — Heparin Sodium (Porcine) Inj 1000 Unit/ML: INTRAMUSCULAR | Qty: 10 | Status: AC

## 2017-11-03 NOTE — Progress Notes (Signed)
eLink Physician-Brief Progress Note Patient Name: Zackarey Kienle DOB: 03-31-1966 MRN: [id number]   Date of Service  11/03/2017  HPI/Events of Note  ABG = 7.295/30.4/70/14.7.  eICU Interventions  Will order: 1. D/C 0.9 NaCl IV infusion.  2. NaHCO3 IV infusion to run IV at 75 mL/hour. 3. ABG at 10 AM.     Intervention Category Major Interventions: Acid-Base disturbance - evaluation and management;Respiratory failure - evaluation and management  Adon Gehlhausen [place] 11/03/2017, 5:02 AM

## 2017-11-03 NOTE — Progress Notes (Signed)
Initial Nutrition Assessment  DOCUMENTATION CODES:   Not applicable  INTERVENTION:    If TF warranted after GOC discussion, recommend: - Goal: Vital AF 1.2 @ 60 ml/hr (1440 ml/day)  Tube feeding regimen provides 1728 kcal, 108 grams of protein (100% estimated protein needs), and 1166 ml of H2O.   Tube feeding regimen and current propofol provides 1987 total kcal (96% estimated energy needs).  NUTRITION DIAGNOSIS:   Inadequate oral intake related to inability to eat as evidenced by NPO status.  GOAL:   Patient will meet greater than or equal to 90% of their needs  MONITOR:   Vent status, Labs, I & O's, Weight trends, Other (Comment)(GOC)  REASON FOR ASSESSMENT:   Ventilator    ASSESSMENT:   52 year old male who presented to the ED for evaluation of STEMI and cardiac arrest. PMH significant for tobacco abuse.  5/14 - intubated by EMS, significant bleeding from ETT so no initiation of hypothermia protocol, question of seizure, cath lab with multiple stents placed  5/15 - EEG indicative of a profound generalized cerebral dysfunction  Spoke with RN at bedside. Per RN, likely plan is to withdraw care. RD to follow for GOC. Pt on multiple pressors at this time.  No family at bedside at time of visit so unable to obtain detailed intake and weight history.  If TF warranted, pt likely to require trickle feedings if remains on multiple pressors. Goal TF recommendations provided above.  Patient is currently intubated on ventilator support. Pt with NG tube to low-intermittent suction. MV: 17.9 L/min Temp (24hrs), Avg:99.3 F (37.4 C), Min:97.2 F (36.2 C), Max:99.9 F (37.7 C) BP: 88/66 MAP: 74  Propofol: 9.8 ml/hr (providds 259 kcal/day) Amiodarone: 16.7 ml/hr Levophed: 31.5 ml/hr Vasopressin: 11.3 ml/hr Heparin  Pt with moderate-to-large left-sided pneumothorax for which a chest tube was emergently placed and is now to suction.  Medications reviewed and include:  sliding scale Novolog, IV antibiotics, sodium bicarb  Labs reviewed: CO2 16 (L), creatinine 2.13 (H), calcium 7.4 (L), elevated AST and ALT, hemoglobin A1C 5.5 CBG's: 113, 159, 230, 238  Chest tube output: 10 ml since admission UOP: 325 ml since admission I/O's: +1.1 L since admission NG output: 100 ml since admission  NUTRITION - FOCUSED PHYSICAL EXAM:    Most Recent Value  Orbital Region  No depletion  Upper Arm Region  Mild depletion  Thoracic and Lumbar Region  No depletion  Buccal Region  Unable to assess  Temple Region  Unable to assess  Clavicle Bone Region  No depletion  Clavicle and Acromion Bone Region  Moderate depletion  Scapular Bone Region  Unable to assess  Dorsal Hand  No depletion  Patellar Region  Mild depletion  Anterior Thigh Region  Mild depletion  Posterior Calf Region  Mild depletion  Edema (RD Assessment)  None  Hair  Reviewed  Eyes  Unable to assess  Mouth  Unable to assess  Skin  Reviewed  Nails  Reviewed       Diet Order:   Diet Order           Diet NPO time specified  Diet effective now          EDUCATION NEEDS:   Not appropriate for education at this time  Skin:  Skin Assessment: Reviewed RN Assessment  Last BM:  unknown/PTA  Height:   Ht Readings from Last 1 Encounters:  11/03/17 5\' 6"  (1.676 m)    Weight:   Wt Readings from Last 1 Encounters:  11/03/17 153 lb 3.5 oz (69.5 kg)    Ideal Body Weight:  64.5 kg   BMI:  Body mass index is 24.73 kg/m.  Estimated Nutritional Needs:   Kcal:  2075 kcal/day (PSU 2003b)  Protein:  105-120 grams/day  Fluid:  >/= 2.0 L/day    Earma Reading, MS, RD, LDN Pager: [phone] Weekend/After Hours: [phone]

## 2017-11-03 NOTE — Progress Notes (Signed)
Spoke indepth with family of patient regarding code status and the process and procedure of a code. Family discussed concerns of pt quality of life post code. Full family consensus was to not allow staff to administer any life saving measures in the event of code. Spoke with [facility] physician regarding family wishes. Pt made DNR status. Will continue to monitor pt. Family support also given.

## 2017-11-03 NOTE — Consult Note (Signed)
PULMONARY / CRITICAL CARE MEDICINE   Name: Scott Mcclure MRN: [id number] DOB: 25-Mar-1966    ADMISSION DATE:  11/14/2017 CONSULTATION DATE: 11/10/2017  REFERRING MD: Dr Eldridge Dace  CHIEF COMPLAINT: STEMI, Cardiac arrest   HISTORY OF PRESENT ILLNESS:   52yoM with hx Tobacco abuse, presented after having a witnessed out-of-hospital cardiac arrest. According to family, patient has been c/o CP for the past 1 year. Then tonight he had a witnessed cardiac arrest, with bystander CPR initiated. EMS was called and on their arrival found patient to be in VFib. He required 40 minutes of CPR prior to achieving ROSC. On arrival to the ER patient had again lost a pulse, this time with VT cardiac arrest. He was shocked multiple times on his way to the cath lab. In the cath lab patient was noted to be having decorticate type posturing of his arms. During the cath patient began having copious bleeding from his ETT. Cath found diffuse coronary lesions, most severe in RCA, requiring multiple stents. IABP and Theone Murdoch placed. On arrival from cath lab to ICU patient noted to have eyes rolled up in his head and having questionable seizure vs myoclonus activity in his shoulders. Neurology called and is at bedside now.   5/14 The patient is is presently in the ICU. He is on the ventialtor. He has a IABP in place with augmnented pressures of 100-110. The patient is requiring high doses of vasopressors. EEG done overnight suggested significant brain injury. The patient has a small left chest tube as he developed a PTX overnight. He has a Swan ganz catheter in the right groin  PAST MEDICAL HISTORY :  He  has a past medical history of Cardiac arrest (HCC) (11/13/2017) and Myocardial infarct (HCC) (10/27/2017).  PAST SURGICAL HISTORY: He  has a past surgical history that includes Coronary/Graft Acute MI Revascularization (N/A, 11/12/2017); LEFT HEART CATH AND CORONARY ANGIOGRAPHY (N/A, 11/18/2017); IABP Insertion (N/A,  11/18/2017); and RIGHT HEART CATH (N/A, 10/22/2017).    No Known Allergies  No current facility-administered medications on file prior to encounter.    No current outpatient medications on file prior to encounter.   FAMILY HISTORY:  His has no family status information on file.   SOCIAL HISTORY: He  is an active smoker. Per patient's mother and brother, he smokes marijuana but no other illicit drugs. No Etoh use. Patient lives with his mother in her house; a friend also lives in the house with the patient's mother.      VITAL SIGNS: BP 110/81   Pulse 73   Temp 99.9 F (37.7 C)   Resp (!) 36   Wt 153 lb 3.5 oz (69.5 kg)   SpO2 94%   HEMODYNAMICS: PAP: (36-49)/(19-29) 36/19 PCWP:  [23 mmHg] 23 mmHg CO:  [2.3 L/min] 2.3 L/min CI:  [1.2 L/min/m2] 1.2 L/min/m2 SBP 120's on no vasopressors  VENTILATOR SETTINGS: Vent Mode: PCV FiO2 (%):  [70 %-100 %] 70 % Set Rate:  [12 bmp-18 bmp] 12 bmp Vt Set:  [620 mL] 620 mL PEEP:  [5 cmH20-8 cmH20] 8 cmH20 Plateau Pressure:  [12 cmH20-24 cmH20] 18 cmH20  INTAKE / OUTPUT: I/O last 3 completed shifts: In: 1378.6 [I.V.:1378.6] Out: 465 [Urine:305; Emesis/NG output:100; Other:50; Chest Tube:10]  PHYSICAL EXAMINATION: General: Thin adult male, intubated and sedated, critically ill Neuro: pupils pinpoint, eyes both deviated upward, no response to sternal rub or nail bed pressure. No spontaneous movements of any extremities at time of my exam. Intermittent muscle contractures in neck and  shoulders that are occurring at regular intervals (?seizure). Reportedly had decorticate movements earlier this evening prior to cath. HEENT: Orally intubated. ETT with copious bright red blood being suctioned Cardiovascular: RRR no m/r/g Lungs: Rhonchi bilaterally, pectus excavatum chest wall deformity, Abrasions to mid-sternum from CPR.  Abdomen: Soft NTND Musculoskeletal: no LE edema  Skin: abrasions to sternal as described above.   LABS:  BMET Recent  Labs  Lab 2017/11/29 1903 11/29/2017 2333 11/03/17 0453  NA 137 138 141  K 3.5 3.9 4.1  CL 103 105 105  CO2 13* 16* 16*  BUN 14 16 18   CREATININE 1.65* 1.95* 2.13*  GLUCOSE 388* 248* 160*   Electrolytes Recent Labs  Lab November 29, 2017 1903 2017-11-29 2333 11/03/17 0453  CALCIUM 7.7* 7.4* 7.4*  MG  --  1.8 1.8  PHOS  --  4.3 4.6   CBC Recent Labs  Lab November 29, 2017 1903 2017-11-29 2333 11/03/17 0453  WBC 33.5* 39.3* 39.0*  HGB 14.9 13.2 13.5  HCT 44.8 39.1 40.5  PLT 228 226 187   Coag's Recent Labs  Lab 2017-11-29 1903 November 29, 2017 2333  APTT 35  --   INR 1.45 1.89   Sepsis Markers Recent Labs  Lab 2017-11-29 2333  LATICACIDVEN 8.4*  PROCALCITON 2.59    ABG Recent Labs  Lab 2017-11-29 2332 11/03/17 0133  PHART 7.328* 7.286*  PCO2ART 31.3* 27.9*  PO2ART 110.0* 71.0*    Liver Enzymes Recent Labs  Lab 29-Nov-2017 1903 29-Nov-2017 2333 11/03/17 0453  AST 294* 582* 929*  ALT 134* 148* 312*  ALKPHOS 67 52 52  BILITOT 1.5* 1.7* 2.4*  ALBUMIN 3.3* 2.9* 2.8*    Cardiac Enzymes Recent Labs  Lab 11-29-2017 1903 11-29-17 2333 11/03/17 0453  TROPONINI 39.49* >65.00* >65.00*   Glucose Recent Labs  Lab 11/29/2017 2333 11/03/17 0119 11/03/17 0447 11/03/17 0805  GLUCAP 238* 230* 159* 113*    Imaging Ct Head Wo Contrast  Result Date: 11/03/2017 CLINICAL DATA:  52 y/o M; altered level of consciousness. History of MI and cardiac arrest. EXAM: CT HEAD WITHOUT CONTRAST TECHNIQUE: Contiguous axial images were obtained from the base of the skull through the vertex without intravenous contrast. COMPARISON:  None. FINDINGS: Brain: There is subtle blurring of gray-white differentiation and greater than expected sulcal effacement for age. Findings may represent early hypoxic ischemic injury. Follow-up CT or MRI of the brain is recommended to assess for interval change. No acute intracranial hemorrhage, focal mass effect, or herniation. Vascular: No hyperdense vessel or unexpected  calcification. Skull: There are multiple sessile scalp lesions at the vertex and in the parietal regions. No acute osseous abnormality identified. Sinuses/Orbits: Opacification the right maxillary sinus with widening of the right ostiomeatal unit which may represent underlying polyp or mucocele. Patchy opacification of anterior ethmoid air cells and the right frontal sinus. Chronic inflammatory changes of the walls of right maxillary sinus. Normal aeration of mastoid air cells. Orbits are unremarkable. Nasoenteric and endotracheal tubes. Other: None. IMPRESSION: 1. Subtle blurring of gray-white differentiation and greater than expected sulcal effacement for age. Findings may represent early hypoxic ischemic injury. Consider follow-up CT or MRI of the brain to assess for interval change. 2. Right maxillary opacification and widening of the right ostiomeatal unit may represent underlying polyp or mucocele. Direct visualization recommended. These results will be called to the ordering clinician or representative by the Radiologist Assistant, and communication documented in the PACS or zVision Dashboard. Electronically Signed   By: Mitzi Hansen M.D.   On: 11/03/2017 00:50  Dg Chest Port 1 View  Result Date: Nov 09, 2017 CLINICAL DATA:  Left pneumothorax follow-up EXAM: PORTABLE CHEST 1 VIEW COMPARISON:  11/09/2017 FINDINGS: Endotracheal tube tip is about 5.8 cm superior to the carina. Esophageal tube tip below the diaphragm but non included. Interim placement of left-sided chest tube with tip positioned at the left lung apex. Decreased left pneumothorax with small residual left apical pneumothorax noted. Moderate right greater than left interstitial and alveolar opacity, slight decrease. Stable cardiomediastinal silhouette. Intra aortic balloon pump tip at the aortic arch. Catheter tubing coursing over the lower mediastinum with tip projecting over the left mediastinal border. IMPRESSION: 1. Placement of  left-sided chest tube with decreased left pneumothorax and tiny residual left apical pneumothorax noted. 2. Moderate right greater than left interstitial and alveolar opacity, slight decreased compared to prior. Findings could be secondary to edema, diffuse pneumonia or ARDS Electronically Signed   By: Jasmine Pang M.D.   On: 2017/11/09 22:47   Dg Chest Port 1 View  Addendum Date: 2017-11-09   ADDENDUM REPORT: 11/09/17 22:10 ADDENDUM: Dr. Merlene Pulling aware of finding and currently placing chest tube per nurse on 09-Nov-2017 at 2205 hours. Electronically Signed   By: Awilda Metro M.D.   On: 09-Nov-2017 22:10   Result Date: 2017/11/09 CLINICAL DATA:  Aortic balloon pump assist device. Status post cardiac arrest. EXAM: PORTABLE CHEST 1 VIEW COMPARISON:  None. FINDINGS: Aortic assist balloon pump marker projects at aortic arch. Endotracheal tube tip projects 6.2 cm above the carina. Nasogastric tube tip projects in proximal stomach. Additional catheter crosses midline tip projecting over LEFT heart. LEFT pneumothorax measuring 3.5 - 4.3 cm from the chest wall with LEFT lung atelectasis and air bronchograms. Confluent bilateral lung consolidation worse within central distribution. No pleural effusion. Cardiac silhouette is mildly enlarged, mediastinal silhouette is nonsuspicious. Soft tissue planes and included osseous structures are normal. IMPRESSION: Moderate to large LEFT pneumothorax without mediastinal shift. Multiple life-support lines including aortic balloon pump assist with marker projecting aortic arch. Dense consolidation concerning for acute pulmonary edema and/or ARDS. Mild cardiomegaly. Electronically Signed: By: Awilda Metro M.D. On: Nov 09, 2017 22:00    STUDIES:  CXR (5/14): diffuse bilateral pulmonary infiltrates; moderate to large left-sided pneumothorax.  Head CT (5/14): pending EEG (5/14): pending   CULTURES: Sputum culture (5/14): pending   ANTIBIOTICS: Zosyn 5/14  >>  SIGNIFICANT EVENTS: 5/14: presented to ER s/p out-of-hospital VF/VT cardiac arrest, taken to cath lab with multiple DES placed, significant bleeding from ETT so not cooled, question of seizure. Neurology consulted. Left-sided pneumothorax presumably related to CPR; chest tube placed.   LINES/TUBES: IABP 5/14 >> Swan Ganz catheter 5/14 >> LLE IO 5/14 >> ETT 5/14 >> NG tube 5/14 >>  DISCUSSION: 52yoM with hx Tobacco abuse, presented to ER s/p out-of-hospital VF/VT cardiac arrest, taken to cath lab with multiple DES placed, significant bleeding from ETT so not cooled, question of seizure. Neurology consulted. Left-sided pneumothorax presumably related to CPR; chest tube placed.  ASSESSMENT / PLAN:   PULMONARY 1. Acute hypoxic and hypercapneic respiratory failure; Pulmonary hemorrhage; Pulmonary infiltrates (aspiration pneumonia vs aspirated blood); Pneumothorax: - CXR on my review shows a moderate-to-large left-sided pneumothorax, for which a left-sided pigtail chest tube was emergently placed. Repeat CXR on my review shows left lung is now re-inflated with Chest tube in appropriate position. Chest tube placed to suction.   - ABG (prior to chest tube) showed combination metabolic and respiratory acidosis. Patient at that time was on AC/PC setting but was  breathing at RR 35, well over set rate, with minute ventilation of 23. Gave 2 amps Bicarb.  - Repeat ABG now post-chest tube placement; place A-line.  - CXR also shows diffuse pulmonary infiltrates bilaterally. Unclear if this represents aspirated blood in the setting of acute pulmonary hemorrhage (suctioning copious frank blood from ETT) versus if this is aspiration pneumonia.   5/14 The patient is currently requiring 70% FI02. He is on Pressure control Ventialtion with an Inspiratory pressure of 10 and PEEP of 8.He is no longer having copious blood from the ET tube. The CXR shows  A right mid lung density and perhaps congestive changes  bilaterally. The left lung has reexpanded post-CT placement.  CARDIOVASCULAR 1. 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 Multiple DES's placed to RCA where heavier disease burden lay.  - IABP and Swan Ganz placed.  - TTE ordered. - Not initiating hypothermia protocol given the large volume of frank blood being suctioned from ETT. - Continue Cangrelor for 8 hours post Brilinta 180 mg via OG tube per Cardiology rec's - Start Statin 5/14 The patient is requiring large doses of pressors. He is on Vasopressin of o.o3 units/minand over 30 mcg of levophed. His recent PCWP awas aboput 14. His cardiac output was about 2.2 This does not augus well for the patient The Ernestine Conrad ganz tip is at the level of thel eft pA. The tip of the balloon pump appears to be in proper position  RENAL 1. AKI: - creatinine 1.65 with no known prior baseline; presumed this is all acute injury, likely related to ATN in setting of the prolonged arrest - foley catheter placed; UA obtained. Add urine lytes as well.  - monitor UOP; avoid nephrotoxic agents. 5/14 Urine output appears to be poor as expected given the profound shock. There does not appear to be an immminent need now for RRT.    2. Hypokalemia: - K 3.5; replete with KCL IV  GASTROINTESTINAL 1. Shock liver:  - transaminitis likely related to shock liver from the cardiac arrest. Continue to trend LFT's daily. - NPO; GI prophylaxis.   HEMATOLOGIC No active issues   INFECTIOUS 1. Aspiration pneumonia: - check sputum culture, procal, lactate; start Zosyn  ENDOCRINE 1. Hyperglycemia: - no hx of DM; NPO; SSI  NEUROLOGIC 1. Questionable seizure vs Myoclonus; Anoxic Encephalopathy: - Neurology consulted - EEG ordered - Anti-epileptic drugs per Neurology. I spoke with neurtology who will follow up wiothuj patient today   FAMILY  - Updates: updated  patient's mother and brother at bedside.  - Inter-disciplinary family meet or Palliative Care meeting due by: 11/08/17  Criticalcare time 45 minutes   Jamesetta So MD [facility] 11/03/2017, 9:06 AM

## 2017-11-03 NOTE — Progress Notes (Signed)
Progress Note  Patient Name: Scott Mcclure Date of Encounter: 11/03/2017  Primary Cardiologist: No primary care provider on file.  - new - [facility]  Subjective   On propofol drip.  Intubated/sedated.  Nonresponsive. Unable to wean pressors overnight, actually added epinephrine drip overnight.  Inpatient Medications    Scheduled Meds: . aspirin  81 mg Oral Daily  . chlorhexidine  15 mL Mouth Rinse BID  . chlorhexidine gluconate (MEDLINE KIT)  15 mL Mouth Rinse BID  . Chlorhexidine Gluconate Cloth  6 each Topical Daily  . insulin aspart  2-6 Units Subcutaneous Q4H  . mouth rinse  15 mL Mouth Rinse Q2H  . metoprolol tartrate  12.5 mg Oral BID  . sodium chloride flush  10-40 mL Intracatheter Q12H  . sodium chloride flush  3 mL Intravenous Q12H  . ticagrelor  90 mg Oral BID   Continuous Infusions: . sodium chloride    . sodium chloride    . amiodarone 30 mg/hr (11/03/17 1900)  . epinephrine 5 mcg/min (11/03/17 2111)  . famotidine (PEPCID) IV Stopped (11/03/17 2111)  . heparin 950 Units/hr (11/03/17 1900)  . norepinephrine (LEVOPHED) Adult infusion 35 mcg/min (11/03/17 2112)  . piperacillin-tazobactam (ZOSYN)  IV 3.375 g (11/03/17 2042)  . propofol (DIPRIVAN) infusion 30 mcg/kg/min (11/03/17 1900)  .  sodium bicarbonate (isotonic) infusion in sterile water 75 mL/hr at 11/03/17 1900   PRN Meds: sodium chloride, sodium chloride, acetaminophen, fentaNYL (SUBLIMAZE) injection, fentaNYL (SUBLIMAZE) injection, ondansetron (ZOFRAN) IV, pneumococcal 23 valent vaccine, sodium chloride flush, sodium chloride flush   Vital Signs    Vitals:   11/03/17 1900 11/03/17 2000 11/03/17 2036 11/03/17 2100  BP: (!) 154/90 (!) 149/83  (!) 146/94  Pulse: 78 82 82 84  Resp: (!) 32 (!) 33 (!) 33 (!) 29  Temp: 99 F (37.2 C) 99.1 F (37.3 C)  99.3 F (37.4 C)  TempSrc:      SpO2: 100% 100% 100% 100%  Weight:      Height:        Intake/Output Summary (Last 24 hours) at 11/03/2017 2223 Last  data filed at 11/03/2017 2052 Gross per 24 hour  Intake 3574.13 ml  Output 545 ml  Net 3029.13 ml   Filed Weights   10/27/2017 1836 11/03/17 0500 11/03/17 0941  Weight: 143 lb 4.8 oz (65 kg) 153 lb 3.5 oz (69.5 kg) 153 lb 3.5 oz (69.5 kg)    Telemetry    Sinus tachycardia- Person intubated, sedated ally Reviewed  ECG    Sinus rhythm, 76 bpm.  Evolving changes for inferior MI, recent- Personally Reviewed  Physical Exam   GEN:  Intubated, sedated.  Ill-appearing Neck: No obvious JVD Cardiac:  Tachycardic, distant S1-S2 no obvious M/R/G Respiratory:  Diffuse coarse bilateral breath sounds GI: Soft, mildly distended.  Decreased bowel sounds MS: No edema; No deformity. Neuro:   per neurology report, unresponsive.  No response to deep sternal rub  Labs    Chemistry Recent Labs  Lab 11/15/2017 1903 11/10/2017 1911 11/14/2017 2333 11/03/17 0453  NA 137 137 138 141  K 3.5 3.4* 3.9 4.1  CL 103 101 105 105  CO2 13*  --  16* 16*  GLUCOSE 388* 383* 248* 160*  BUN 14 15 16 18   CREATININE 1.65* 1.30* 1.95* 2.13*  CALCIUM 7.7*  --  7.4* 7.4*  PROT 5.4*  --  4.6* 4.8*  ALBUMIN 3.3*  --  2.9* 2.8*  AST 294*  --  582* 929*  ALT 134*  --  148* 312*  ALKPHOS 67  --  52 52  BILITOT 1.5*  --  1.7* 2.4*  GFRNONAA 47*  --  38* 34*  GFRAA 54*  --  44* 40*  ANIONGAP 21*  --  17* 20*     Hematology Recent Labs  Lab 10/30/2017 1903 11/19/2017 1911 11/11/2017 2333 11/03/17 0453  WBC 33.5*  --  39.3* 39.0*  RBC 5.09  --  4.53 4.60  HGB 14.9 15.3 13.2 13.5  HCT 44.8 45.0 39.1 40.5  MCV 88.0  --  86.3 88.0  MCH 29.3  --  29.1 29.3  MCHC 33.3  --  33.8 33.3  RDW 12.6  --  12.9 13.2  PLT 228  --  226 187    Cardiac Enzymes Recent Labs  Lab 11/05/2017 1903 10/23/2017 2333 11/03/17 0453  TROPONINI 39.49* >65.00* >65.00*   No results for input(s): TROPIPOC in the last 168 hours.   BNPNo results for input(s): BNP, PROBNP in the last 168 hours.   DDimer No results for input(s): DDIMER in  the last 168 hours.   Radiology    Ct Head Wo Contrast  Result Date: 11/03/2017 CLINICAL DATA:  52 y/o M; altered level of consciousness. History of MI and cardiac arrest. EXAM: CT HEAD WITHOUT CONTRAST TECHNIQUE: Contiguous axial images were obtained from the base of the skull through the vertex without intravenous contrast. COMPARISON:  None. FINDINGS: Brain: There is subtle blurring of gray-white differentiation and greater than expected sulcal effacement for age. Findings may represent early hypoxic ischemic injury. Follow-up CT or MRI of the brain is recommended to assess for interval change. No acute intracranial hemorrhage, focal mass effect, or herniation. Vascular: No hyperdense vessel or unexpected calcification. Skull: There are multiple sessile scalp lesions at the vertex and in the parietal regions. No acute osseous abnormality identified. Sinuses/Orbits: Opacification the right maxillary sinus with widening of the right ostiomeatal unit which may represent underlying polyp or mucocele. Patchy opacification of anterior ethmoid air cells and the right frontal sinus. Chronic inflammatory changes of the walls of right maxillary sinus. Normal aeration of mastoid air cells. Orbits are unremarkable. Nasoenteric and endotracheal tubes. Other: None. IMPRESSION: 1. Subtle blurring of gray-white differentiation and greater than expected sulcal effacement for age. Findings may represent early hypoxic ischemic injury. Consider follow-up CT or MRI of the brain to assess for interval change. 2. Right maxillary opacification and widening of the right ostiomeatal unit may represent underlying polyp or mucocele. Direct visualization recommended. These results will be called to the ordering clinician or representative by the Radiologist Assistant, and communication documented in the PACS or zVision Dashboard. Electronically Signed   By: Kristine Garbe M.D.   On: 11/03/2017 00:50   Dg Chest Port 1  View  Result Date: 11/03/2017 CLINICAL DATA:  Status post MI and cardiac arrest. Currently on intra aortic balloon pump assistance. EXAM: PORTABLE CHEST 1 VIEW COMPARISON:  Portable chest x-ray of Nov 02, 2017 FINDINGS: The lungs are well-expanded. No definite left-sided pneumothorax is visible today. There is confluent density superior medially in the left upper lobe that is fairly stable and may reflect aspiration. The interstitial markings are increased but slightly less conspicuous. The heart is normal in size. The pulmonary vascularity is mildly prominent. The intra-aortic balloon pump marker projects approximately 5 mm below the superior margin of the aortic arch. The endotracheal tube tip projects 6.4 cm above the carina. The esophagogastric tube tip and proximal port project below the GE junction. The  small caliber left chest tube remains in the apex. IMPRESSION: Persistent moderate pulmonary interstitial edema. Possible atelectasis or aspiration pneumonia in the right upper lobe. No pneumothorax. The support tubes and devices are in stable position. Electronically Signed   By: David  Martinique M.D.   On: 11/03/2017 09:52   Dg Chest Port 1 View  Result Date: 10/22/2017 CLINICAL DATA:  Left pneumothorax follow-up EXAM: PORTABLE CHEST 1 VIEW COMPARISON:  11/17/2017 FINDINGS: Endotracheal tube tip is about 5.8 cm superior to the carina. Esophageal tube tip below the diaphragm but non included. Interim placement of left-sided chest tube with tip positioned at the left lung apex. Decreased left pneumothorax with small residual left apical pneumothorax noted. Moderate right greater than left interstitial and alveolar opacity, slight decrease. Stable cardiomediastinal silhouette. Intra aortic balloon pump tip at the aortic arch. Catheter tubing coursing over the lower mediastinum with tip projecting over the left mediastinal border. IMPRESSION: 1. Placement of left-sided chest tube with decreased left  pneumothorax and tiny residual left apical pneumothorax noted. 2. Moderate right greater than left interstitial and alveolar opacity, slight decreased compared to prior. Findings could be secondary to edema, diffuse pneumonia or ARDS Electronically Signed   By: Donavan Foil M.D.   On: 10/25/2017 22:47   Dg Chest Port 1 View  Addendum Date: 10/31/2017   ADDENDUM REPORT: 11/03/2017 22:10 ADDENDUM: Dr. Jimmey Ralph aware of finding and currently placing chest tube per nurse on Nov 02, 2017 at 2205 hours. Electronically Signed   By: Elon Alas M.D.   On: 11/08/2017 22:10   Result Date: 10/20/2017 CLINICAL DATA:  Aortic balloon pump assist device. Status post cardiac arrest. EXAM: PORTABLE CHEST 1 VIEW COMPARISON:  None. FINDINGS: Aortic assist balloon pump marker projects at aortic arch. Endotracheal tube tip projects 6.2 cm above the carina. Nasogastric tube tip projects in proximal stomach. Additional catheter crosses midline tip projecting over LEFT heart. LEFT pneumothorax measuring 3.5 - 4.3 cm from the chest wall with LEFT lung atelectasis and air bronchograms. Confluent bilateral lung consolidation worse within central distribution. No pleural effusion. Cardiac silhouette is mildly enlarged, mediastinal silhouette is nonsuspicious. Soft tissue planes and included osseous structures are normal. IMPRESSION: Moderate to large LEFT pneumothorax without mediastinal shift. Multiple life-support lines including aortic balloon pump assist with marker projecting aortic arch. Dense consolidation concerning for acute pulmonary edema and/or ARDS. Mild cardiomegaly. Electronically Signed: By: Elon Alas M.D. On: 10/22/2017 22:00    Cardiac Studies    Cath-PCI 11/14/2017:  pRCA 100% -> mRCA 95% & dRCA 90% (Asp Thrombectomy & DES PCI 2 overlapping stents covering all 3 lesions -3.5 x 38 & 3.0 x 38); pLAD 90%, ostOM1 100%.  LAD-PDA and OM-RPL collaterals.  Moderately elevated LVEDP. --> IABP placed  2D  Echo 10/25/2017: To assess LV function.  Poor visibility.  Recommend Definity  Patient Profile     52 y.o. male with no prior cardiac history who was taken to the Cath Lab emergently on the evening of 11/10/2017 by Dr. Irish Lack following a prolonged cardiac arrest of roughly 40 minutes.  4 shocks and CPR for 10 minutes.  Post ROSC EKG demonstrated inferior ST elevations. He was found to have a totally occluded RCA with extensive disease beyond the occlusion as well as diffuse multivessel disease. He was also noted to be cardiogenic shock.  Dr. Irish Lack did extensive thrombectomy and PTCA and PCI of most of the circumflex using 2 long stents.  Due to cardiac shock, after discussion interventional colleagues, he  felt based on the patient's illness level he would place a balloon pump as opposed to Impella.  Balloon pump remains 1: 1, and pressors have been titrated up as opposed to down.  Is now on levophed, vasopressin as well as epinephrine drip.  Assessment & Plan    Principal Problem:   Acute inferior myocardial infarction [facility]) Active Problems:   Cardiac arrest ([place])   Pressure injury of skin   Cardiogenic shock ([facility])   Multiple vessel coronary artery disease   Presence of drug coated stent in right coronary artery   Anoxic brain injury ([place])  Unfortunately Silvester appears to have severe anoxic brain injury.  He remains in significant cardiac shock.  I temporarily placed on standby for physical exam, his pulse is down to the low 90s with MAPS dropping into the high low 60s to 50s. My understanding is that they would like to take him for MRI of the head which would require to be removed.  At this point, I do not believe he reveals successfully wean him off the balloon pump With this extent of pressure support.   On heparin drip for balloon pump  Converted from Cangrelor to Brilinta   could consider adding high-dose statin  He remains on amiodarone and has had no further  arrhythmias.  Unfortunately, echocardiogram was not helpful for EF assessment.  Would recommend a limited echo with Definity contrast if there is a sense that he may have a sense of meaningful recovery. Unfortunately it appears that he likely will not have any neurologic recovery. Discussions will be had with family reference consideration of DNR.  Certainly if he were to have recurrence of arrhythmia and cardiac arrest, he would not survive.  We will need to be seen in the next 24 to 48 hours his amount of recovery he would need maximum hemodynamic support possible to allow for cerebral perfusion.  We will follow along these assistance.   For questions or updates, please contact [place] Please consult [email or web address] for contact info under Cardiology/STEMI.      Signed, Glenetta Hew, MD  11/03/2017, 10:23 PM

## 2017-11-03 NOTE — Plan of Care (Signed)
CRITICAL VALUE ALERT  Critical Value:  Lactic acid = 14.1  Date & Time Notied:  5/15 1700  Provider Notified: Franchot Erichsen MD  Orders Received/Actions taken: No new orders

## 2017-11-03 NOTE — Progress Notes (Signed)
eLink Physician-Brief Progress Note Patient Name: Scott Mcclure DOB: August 22, 1965 MRN: [id number]   Date of Service  11/03/2017  HPI/Events of Note  ABG on 80%/PC 10/Rate 12/P8 = 7.28/27/71/13.  eICU Interventions  Will order: 1. NaHCO3 100 meq IV now.      Intervention Category Major Interventions: Acid-Base disturbance - evaluation and management;Respiratory failure - evaluation and management  Alekai Pocock [place] 11/03/2017, 1:44 AM

## 2017-11-03 NOTE — Progress Notes (Signed)
ANTICOAGULATION CONSULT NOTE - Initial Consult  Pharmacy Consult for heparin  Indication: IABP  No Known Allergies  Patient Measurements: Height: 5' 6"  (167.6 cm) Weight: 153 lb 3.5 oz (69.5 kg) IBW/kg (Calculated) : 63.8   Vital Signs: Temp: 99 F (37.2 C) (05/15 1845) Temp Source: Core (05/15 1200) BP: 137/93 (05/15 1800) Pulse Rate: 78 (05/15 1845)  Labs: Recent Labs    11/17/2017 1903 11/05/2017 1911 10/24/2017 2333 11/03/17 0453 11/03/17 1722  HGB 14.9 15.3 13.2 13.5  --   HCT 44.8 45.0 39.1 40.5  --   PLT 228  --  226 187  --   APTT 35  --   --   --   --   LABPROT 17.5*  --  21.5*  --   --   INR 1.45  --  1.89  --   --   HEPARINUNFRC  --   --   --   --  <0.10*  CREATININE 1.65* 1.30* 1.95* 2.13*  --   TROPONINI 39.49*  --  >65.00* >65.00*  --     Estimated Creatinine Clearance: 37 mL/min (A) (by C-G formula based on SCr of 2.13 mg/dL (H)).   Medical History: Past Medical History:  Diagnosis Date  . Cardiac arrest ([place]) 10/21/2017  . Myocardial infarct ([place]) 10/25/2017    Medications:  No medications prior to admission.   Scheduled:  . aspirin  81 mg Oral Daily  . chlorhexidine  15 mL Mouth Rinse BID  . chlorhexidine gluconate (MEDLINE KIT)  15 mL Mouth Rinse BID  . Chlorhexidine Gluconate Cloth  6 each Topical Daily  . insulin aspart  2-6 Units Subcutaneous Q4H  . mouth rinse  15 mL Mouth Rinse Q2H  . metoprolol tartrate  12.5 mg Oral BID  . sodium chloride flush  10-40 mL Intracatheter Q12H  . sodium chloride flush  3 mL Intravenous Q12H  . ticagrelor  90 mg Oral BID    Assessment:  52 y.o. male admitted on 11/15/2017 with cardiac arrest with V-tach/V-fib, s/p Cath with IABP placement. Pharmacy consulted to dose heparin   Heparin level undetectable on 800 units/hr  Goal of Therapy:  Heparin level 0.2-0.5 units/ml Monitor platelets by anticoagulation protocol: Yes   Plan:  -Increase heparin rate to 950 units/hr -Heparin level in 8 hours with AM  labs  Maryanna Shape, PharmD, BCPS  Clinical Pharmacist  Pager: [phone]  11/03/2017 6:54 PM

## 2017-11-03 NOTE — Progress Notes (Addendum)
Progress note-Neurology       Subjective: Patient currently on propofol, intubated and sedated.  He is on 5 mg propofol per hour.  Exam: Vitals:   11/03/17 0845 11/03/17 0941  BP:  110/81  Pulse:  73  Resp: (!) 36 (!) 36  Temp: 99.9 F (37.7 C) 99.9 F (37.7 C)  SpO2:      Physical Exam   HEENT-  Normocephalic, no lesions, without obvious abnormality.  Normal external eye and conjunctiva.   Lungs- Saturations within normal limits Extremities- Warm, dry and intact Neuro:  Mental Status: Patient does not respond to verbal stimuli.  Does not respond to deep sternal rub.  Does not follow commands.  No verbalizations noted.  Cranial Nerves: II: patient does not respond confrontation bilaterally,  III,IV,VI: doll's response absent bilaterally. pupils right 2 mm, left 2 mm,and nonreactive bilaterally, upward gaze V,VII: corneal reflex absent bilaterally  VIII: patient does not respond to verbal stimuli IX,X: gag reflex absent, XI: trapezius strength unable to test bilaterally XII: tongue strength unable to test Motor: Extremities flaccid throughout.  No spontaneous movement noted.  No purposeful movements noted. Sensory: Does not respond to noxious stimuli in any extremity. Deep Tendon Reflexes:  Absent throughout. Plantars: equivocal bilaterally Cerebellar: Unable to perform    Medications:  Prior to Admission:  No medications prior to admission.   Scheduled: . aspirin  81 mg Oral Daily  . atorvastatin  80 mg Oral q1800  . chlorhexidine  15 mL Mouth Rinse BID  . chlorhexidine gluconate (MEDLINE KIT)  15 mL Mouth Rinse BID  . insulin aspart  2-6 Units Subcutaneous Q4H  . mouth rinse  15 mL Mouth Rinse QID  . mouth rinse  15 mL Mouth Rinse Q2H  . metoprolol tartrate  12.5 mg Oral BID  . sodium chloride flush  3 mL Intravenous Q12H  . ticagrelor  90 mg Oral BID   Continuous: . sodium chloride    . sodium chloride    . amiodarone 30 mg/hr (11/03/17 0800)  .  famotidine (PEPCID) IV 20 mg (11/03/17 0926)  . heparin 800 Units/hr (11/03/17 0933)  . norepinephrine (LEVOPHED) Adult infusion 29.973 mcg/min (11/03/17 0800)  . piperacillin-tazobactam (ZOSYN)  IV Stopped (11/03/17 0844)  . propofol (DIPRIVAN) infusion 15.128 mcg/kg/min (11/03/17 0800)  .  sodium bicarbonate (isotonic) infusion in sterile water 75 mL/hr at 11/03/17 0800  . vasopressin (PITRESSIN) infusion - *FOR SHOCK* 0.03 Units/min (11/03/17 0800)   TKW:IOXBDZ chloride, sodium chloride, acetaminophen, fentaNYL (SUBLIMAZE) injection, fentaNYL (SUBLIMAZE) injection, ondansetron (ZOFRAN) IV, sodium chloride flush  Pertinent Labs/Diagnostics: EEG: EEG Abnormalities: 1) burst suppression EEG Clinical Interpretation: This EEG is indicative of a profound generalized cerebral dysfunction as can be seen with anoxic brain injury.  In this clinical scenario, this EEG is highly correlated with a poor prognosis.  Ct Head Wo Contrast  Result Date: 11/03/2017 CLINICAL DATA:  52 y/o M; altered level of consciousness. History of MI and cardiac arrest. EXAM: CT HEAD WITHOUT CONTRAST TECHNIQUE: Contiguous axial images were obtained from the base of the skull through the vertex without intravenous contrast. COMPARISON:  None. FINDINGS: Brain: There is subtle blurring of gray-white differentiation and greater than expected sulcal effacement for age. Findings may represent early hypoxic ischemic injury. Follow-up CT or MRI of the brain is recommended to assess for interval change. No acute intracranial hemorrhage, focal mass effect, or herniation. Vascular: No hyperdense vessel or unexpected calcification. Skull: There are multiple sessile scalp lesions at the vertex and in  the parietal regions. No acute osseous abnormality identified. Sinuses/Orbits: Opacification the right maxillary sinus with widening of the right ostiomeatal unit which may represent underlying polyp or mucocele. Patchy opacification of anterior  ethmoid air cells and the right frontal sinus. Chronic inflammatory changes of the walls of right maxillary sinus. Normal aeration of mastoid air cells. Orbits are unremarkable. Nasoenteric and endotracheal tubes. Other: None. IMPRESSION: 1. Subtle blurring of gray-white differentiation and greater than expected sulcal effacement for age. Findings may represent early hypoxic ischemic injury. Consider follow-up CT or MRI of the brain to assess for interval change. 2. Right maxillary opacification and widening of the right ostiomeatal unit may represent underlying polyp or mucocele. Direct visualization recommended. These results will be called to the ordering clinician or representative by the Radiologist Assistant, and communication documented in the PACS or zVision Dashboard. Electronically Signed   By: Kristine Garbe M.D.   On: 11/03/2017 00:50      Etta Quill PA-C [facility] [phone]  Attending addendum I have seen and examined the patient with Etta Quill, PA-C I have made changes to the notes myself above. I agree with the history and physical documented above. I have reviewed the imaging independently.  CT of the head shows blurred gray-white junction suggesting diffuse anoxic injury.  My assessment and recommendations are listed below.  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  EEG concerning for burst suppression pattern, clinical myoclonus, clinical exam showing no brainstem function other than cough and gag and CT of the head showing blurred gray-white junction suggesting hypoxic ischemic injury-all of these portend a poor prognosis for neurologically meaningful recovery.  Impression Hypoxic/anoxic brain injury  Recommendations: -May consider MRI brain to look for further ischemic injury after the LTM EEG leads are taken off.  I am still waiting for the  official LTM EEG read.  Will order MRI once ready to discontinue EEG. -We will continue to follow him.  If his exam remains poor 48 to 72 hours after the event, then it might be reasonable to speak with the family regarding goals of care. - Use Keppra, Depakote or Klonopin for myoclonic movements as needed.  No need for standing doses of antiepileptics for right now. -Pending formal long-term video EEG reading.  Will update recommendations based on those readings.  11/03/2017, 9:48 AM  -- Amie Portland, MD [facility] Pager: [phone] If 7pm to 7am, please call on call as listed on AMION.   CRITICAL CARE ATTESTATION This patient is critically ill and at significant risk of neurological worsening, death and care requires constant monitoring of vital signs, hemodynamics,respiratory and cardiac monitoring. I spent 40  minutes of neurocritical care time performing neurological assessment, discussion with family, other specialists and medical decision making of high complexityin the care of  this patient.

## 2017-11-03 NOTE — Progress Notes (Signed)
Visited with the family of this patient in the waiting room  They are appropriately teary over the poor prognosis they received from the doctor today.  This is a very spiritual family and they believe all is well and God is in the midst.  They realize it is out of their hands and are doing okay supporting and helping each other through this situation.  I will continue to follow up as needed.    11/03/17 1539  Clinical Encounter Type  Visited With Family  Visit Type Follow-up;Spiritual support

## 2017-11-03 NOTE — Procedures (Signed)
History: 52 year old male status post cardiac arrest  Sedation: Low doses of propofol shortly prior to the EEG  Technique: This is a 21 channel routine scalp EEG performed at the bedside with bipolar and monopolar montages arranged in accordance to the international 10/20 system of electrode placement. One channel was dedicated to EKG recording.   Background: The background is suppressed with the exception of intermittent bursts of high-voltage delta/theta activity lasting 1 to 3 seconds.  These bursts are associated with subtle clinical myoclonus of the jaw and eye opening.  These bursts happen about every 10 to 30 seconds.  Photic stimulation: Physiologic driving is not performed  EEG Abnormalities: 1) burst suppression EEG  Clinical Interpretation: This EEG is indicative of a profound generalized cerebral dysfunction as can be seen with anoxic brain injury.  In this clinical scenario, this EEG is highly correlated with a poor prognosis.  Ritta Slot, MD [facility] [phone]  If 7pm- 7am, please page neurology on call as listed in AMION.

## 2017-11-03 NOTE — Progress Notes (Signed)
  Echocardiogram 2D Echocardiogram has been performed.  Scott Mcclure 11/03/2017, 4:23 PM

## 2017-11-03 NOTE — Progress Notes (Addendum)
ANTICOAGULATION CONSULT NOTE - Initial Consult  Pharmacy Consult for heparin  Indication: IABP  No Known Allergies  Patient Measurements: Weight: 153 lb 3.5 oz (69.5 kg)   Vital Signs: Temp: 99.9 F (37.7 C) (05/15 0830) BP: 110/81 (05/15 0800) Pulse Rate: 73 (05/15 0815)  Labs: Recent Labs    11/14/2017 1903 11/15/2017 2333 11/03/17 0453  HGB 14.9 13.2 13.5  HCT 44.8 39.1 40.5  PLT 228 226 187  APTT 35  --   --   LABPROT 17.5* 21.5*  --   INR 1.45 1.89  --   CREATININE 1.65* 1.95* 2.13*  TROPONINI 39.49* >65.00* >65.00*    CrCl cannot be calculated (Unknown ideal weight.).   Medical History: Past Medical History:  Diagnosis Date  . Cardiac arrest ([place]) 11/15/2017  . Myocardial infarct ([place]) 11/07/2017    Medications:  No medications prior to admission.   Scheduled:  . aspirin  81 mg Oral Daily  . atorvastatin  80 mg Oral q1800  . chlorhexidine  15 mL Mouth Rinse BID  . chlorhexidine gluconate (MEDLINE KIT)  15 mL Mouth Rinse BID  . insulin aspart  2-6 Units Subcutaneous Q4H  . mouth rinse  15 mL Mouth Rinse QID  . mouth rinse  15 mL Mouth Rinse Q2H  . metoprolol tartrate  12.5 mg Oral BID  . sodium chloride flush  3 mL Intravenous Q12H  . ticagrelor  90 mg Oral BID  . ticagrelor  90 mg Oral Once    Assessment:  52 y.o. male admitted on 10/28/2017 with cardiac arrest with V-tach/V-fib, s/p Cath with IABP placement. Pharmacy consulted to dose heparin  -Hg= 13.5, plt= 187  Goal of Therapy:  Heparin level 0.2-0.5 units/ml Monitor platelets by anticoagulation protocol: Yes   Plan:  -No heparin bolus -Begin heparin at 800 units/hr -Heparin level in 8 hours and daily wth CBC daily  Hildred Laser, PharmD Clinical Pharmacist Clinical phone from 8:30-4:00 is [phone] After 4pm, please call Main [facility] [phone]) for assistance. 11/03/2017 8:53 AM

## 2017-11-04 ENCOUNTER — Inpatient Hospital Stay (HOSPITAL_COMMUNITY): Payer: Medicaid Other

## 2017-11-04 ENCOUNTER — Other Ambulatory Visit: Payer: Self-pay

## 2017-11-04 DIAGNOSIS — E872 Acidosis, unspecified: Secondary | ICD-10-CM | POA: Clinically undetermined

## 2017-11-04 DIAGNOSIS — I469 Cardiac arrest, cause unspecified: Secondary | ICD-10-CM

## 2017-11-04 DIAGNOSIS — Z9889 Other specified postprocedural states: Secondary | ICD-10-CM

## 2017-11-04 LAB — BASIC METABOLIC PANEL
Anion gap: 19 — ABNORMAL HIGH (ref 5–15)
BUN: 46 mg/dL — AB (ref 6–20)
CHLORIDE: 90 mmol/L — AB (ref 101–111)
CO2: 27 mmol/L (ref 22–32)
CREATININE: 5.12 mg/dL — AB (ref 0.61–1.24)
Calcium: 6.8 mg/dL — ABNORMAL LOW (ref 8.9–10.3)
GFR calc Af Amer: 14 mL/min — ABNORMAL LOW (ref >60)
GFR calc non Af Amer: 12 mL/min — ABNORMAL LOW (ref >60)
GLUCOSE: 108 mg/dL — AB (ref 65–99)
Potassium: 3.7 mmol/L (ref 3.5–5.1)
Sodium: 136 mmol/L (ref 135–145)

## 2017-11-04 LAB — HEPARIN LEVEL (UNFRACTIONATED)
HEPARIN UNFRACTIONATED: 0.16 [IU]/mL — AB (ref 0.30–0.70)
Heparin Unfractionated: <0.1 IU/mL — ABNORMAL LOW (ref 0.30–0.70)

## 2017-11-04 LAB — GLUCOSE, CAPILLARY
GLUCOSE-CAPILLARY: 118 mg/dL — AB (ref 65–99)
Glucose-Capillary: 103 mg/dL — ABNORMAL HIGH (ref 65–99)
Glucose-Capillary: 116 mg/dL — ABNORMAL HIGH (ref 65–99)
Glucose-Capillary: 148 mg/dL — ABNORMAL HIGH (ref 65–99)
Glucose-Capillary: 214 mg/dL — ABNORMAL HIGH (ref 65–99)
Glucose-Capillary: 94 mg/dL (ref 65–99)

## 2017-11-04 LAB — CBC
HEMATOCRIT: 35.7 % — AB (ref 39.0–52.0)
HEMOGLOBIN: 12.5 g/dL — AB (ref 13.0–17.0)
MCH: 29.4 pg (ref 26.0–34.0)
MCHC: 35 g/dL (ref 30.0–36.0)
MCV: 84 fL (ref 78.0–100.0)
Platelets: 85 10*3/uL — ABNORMAL LOW (ref 150–400)
RBC: 4.25 MIL/uL (ref 4.22–5.81)
RDW: 12.7 % (ref 11.5–15.5)
WBC: 28.8 10*3/uL — ABNORMAL HIGH (ref 4.0–10.5)

## 2017-11-04 LAB — POCT I-STAT 3, ART BLOOD GAS (G3+)
Acid-Base Excess: 8 mmol/L — ABNORMAL HIGH (ref 0.0–2.0)
BICARBONATE: 31 mmol/L — AB (ref 20.0–28.0)
O2 SAT: 100 %
TCO2: 32 mmol/L (ref 22–32)
pCO2 arterial: 37.6 mmHg (ref 32.0–48.0)
pH, Arterial: 7.524 — ABNORMAL HIGH (ref 7.350–7.450)
pO2, Arterial: 165 mmHg — ABNORMAL HIGH (ref 83.0–108.0)

## 2017-11-04 LAB — PROTIME-INR
INR: 2.74
PROTHROMBIN TIME: 28.8 s — AB (ref 11.4–15.2)

## 2017-11-04 LAB — ECHOCARDIOGRAM LIMITED
Height: 66 in
WEIGHTICAEL: 2500.9 [oz_av]

## 2017-11-04 LAB — LACTIC ACID, PLASMA: LACTIC ACID, VENOUS: 2.3 mmol/L — AB (ref 0.5–1.9)

## 2017-11-04 LAB — PROCALCITONIN: PROCALCITONIN: 29.15 ng/mL

## 2017-11-04 MED ORDER — NOREPINEPHRINE BITARTRATE 1 MG/ML IV SOLN
0.0000 ug/min | INTRAVENOUS | Status: DC
  Filled 2017-11-04: qty 4

## 2017-11-04 MED ORDER — PIPERACILLIN-TAZOBACTAM IN DEX 2-0.25 GM/50ML IV SOLN
2.2500 g | Freq: Four times a day (QID) | INTRAVENOUS | Status: DC
  Administered 2017-11-04 – 2017-11-05 (×5): 2.25 g via INTRAVENOUS
  Filled 2017-11-04 (×7): qty 50

## 2017-11-04 MED ORDER — PERFLUTREN LIPID MICROSPHERE
1.0000 mL | INTRAVENOUS | Status: AC | PRN
  Administered 2017-11-04: 3 mL via INTRAVENOUS
  Filled 2017-11-04 (×2): qty 10

## 2017-11-04 MED ORDER — NOREPINEPHRINE BITARTRATE 1 MG/ML IV SOLN
0.0000 ug/min | INTRAVENOUS | Status: DC
  Administered 2017-11-04: 30 ug/min via INTRAVENOUS
  Filled 2017-11-04: qty 16

## 2017-11-04 MED ORDER — DOBUTAMINE IN D5W 4-5 MG/ML-% IV SOLN
2.5000 ug/kg/min | INTRAVENOUS | Status: DC
  Administered 2017-11-04: 2.5 ug/kg/min via INTRAVENOUS
  Filled 2017-11-04: qty 250

## 2017-11-04 MED ORDER — SODIUM CHLORIDE 0.9 % IV SOLN
INTRAVENOUS | Status: DC
  Administered 2017-11-04: 15:00:00 via INTRAVENOUS

## 2017-11-04 MED ORDER — FAMOTIDINE IN NACL 20-0.9 MG/50ML-% IV SOLN
20.0000 mg | INTRAVENOUS | Status: DC
  Administered 2017-11-05: 20 mg via INTRAVENOUS
  Filled 2017-11-04: qty 50

## 2017-11-04 NOTE — Significant Event (Signed)
Called by nurse at 21:45 as patient with acutely elevated blood pressure, HR increased from 80s to 130s-150s. Dobutamine was stopped and ECG completed which showed concern for worsening ST elevation in inferior leads with HR in the 150s. Notably, ECG from yesterday morning with residual ST elevation in inferior leads since admission with STEMI. Repeat ECG at 2057 with ongoing ST elevation with HR 130s, although appears similar to ECG from 5/15. Discussed changes with on call interventionalist, who agreed that given overall picture of unclear neurologic recovery, ongoing hemodynamic instability and minimal ECG change from prior with lower HR, would not take patient back to the cath lab tonight. Given the acutely elevated HR and labile blood pressures, agree with holding dobutamine as recommended by critical care at this time while further investigation is completed.  Discussed at length with the family the critically ill nature of the patient and the changes observed tonight. They are in understanding and agree with the plan.  Starlyn Skeans, MD

## 2017-11-04 NOTE — Progress Notes (Signed)
ANTICOAGULATION CONSULT NOTE   Pharmacy Consult for heparin  Indication: IABP  No Known Allergies  Patient Measurements: Height: 5' 6"  (167.6 cm) Weight: 156 lb 4.9 oz (70.9 kg) IBW/kg (Calculated) : 63.8   Vital Signs: Temp: 100.4 F (38 C) (05/16 1415) BP: 133/97 (05/16 1400) Pulse Rate: 86 (05/16 1415)  Labs: Recent Labs    11/08/2017 1903  10/31/2017 2333 11/03/17 0453 11/03/17 1722 11/04/17 0438 11/04/17 0908 11/04/17 1403  HGB 14.9   < > 13.2 13.5  --  12.5*  --   --   HCT 44.8   < > 39.1 40.5  --  35.7*  --   --   PLT 228  --  226 187  --  85*  --   --   APTT 35  --   --   --   --   --   --   --   LABPROT 17.5*  --  21.5*  --   --  28.8*  --   --   INR 1.45  --  1.89  --   --  2.74  --   --   HEPARINUNFRC  --   --   --   --  <0.10* <0.10*  --  0.16*  CREATININE 1.65*   < > 1.95* 2.13*  --   --  5.12*  --   TROPONINI 39.49*  --  >65.00* >65.00*  --   --   --   --    < > = values in this interval not displayed.    Estimated Creatinine Clearance: 15.4 mL/min (A) (by C-G formula based on SCr of 5.12 mg/dL (H)).   Medical History: Past Medical History:  Diagnosis Date  . Cardiac arrest ([place]) 11/16/2017  . Myocardial infarct ([place]) 10/30/2017    Medications:  No medications prior to admission.   Scheduled:  . aspirin  81 mg Oral Daily  . chlorhexidine  15 mL Mouth Rinse BID  . chlorhexidine gluconate (MEDLINE KIT)  15 mL Mouth Rinse BID  . Chlorhexidine Gluconate Cloth  6 each Topical Daily  . insulin aspart  2-6 Units Subcutaneous Q4H  . mouth rinse  15 mL Mouth Rinse Q2H  . metoprolol tartrate  12.5 mg Oral BID  . sodium chloride flush  10-40 mL Intracatheter Q12H  . sodium chloride flush  3 mL Intravenous Q12H  . ticagrelor  90 mg Oral BID    Assessment:  51 y.o. male admitted on 10/26/2017 with cardiac arrest with V-tach/V-fib, s/p Cath with IABP placement. Pharmacy consulted to dose heparin   Heparin level subtherapeutic, 0.16 on 1100 units/hr. INR  is 2.74 likely due to shock, will monitor closely. H/H slightly low, but significant drop in platelets likely due to IABP. No signs/sx of bleeding documented.  Goal of Therapy:  Heparin level 0.2-0.5 units/ml Monitor platelets by anticoagulation protocol: Yes   Plan:  Increase heparin to 1200 units/hr 8 hour heparin level Monitor PLTs closely Daily heparin level, CBC Monitor clinical course, s/sx bleeding   Nida Boatman, PharmD PGY1 Acute Care Pharmacy Resident Pager: [phone]  11/04/2017 2:42 PM

## 2017-11-04 NOTE — Progress Notes (Signed)
eLink Physician-Brief Progress Note Patient Name: Scott Mcclure DOB: 10-Aug-1965 MRN: [id number]   Date of Service  11/04/2017  HPI/Events of Note  Hypotension CXR OK, small left apical pneumothorax with chest tube in place  eICU Interventions  Add back levophed Prognosis grim     Intervention Category Major Interventions: Shock - evaluation and management  Max Fickle 11/04/2017, 9:44 PM

## 2017-11-04 NOTE — Plan of Care (Signed)
CRITICAL VALUE ALERT  Critical Value:  Lactic acid 2.3  Date & Time Notied:  11/04/17   8882  Provider Notified: Franchot Erichsen  Orders Received/Actions taken: new orders

## 2017-11-04 NOTE — Progress Notes (Addendum)
Neurology Progress Note   S:// Unequal pupils overnight. Remains unstable from a cardiac standpoint  O:// Current vital signs: BP (!) 145/90   Pulse 90   Temp 100 F (37.8 C)   Resp (!) 33   Ht _0  (1.676 m)   Wt 70.9 kg (156 lb 4.9 oz)   SpO2 100%   BMI 25.23 kg/m  Vital signs in last 24 hours: Temp:  [98.8 F (37.1 C)-100 F (37.8 C)] 100 F (37.8 C) (05/16 0945) Pulse Rate:  [68-91] 90 (05/16 0945) Resp:  [25-36] 33 (05/16 0945) BP: (88-155)/(66-100) 145/90 (05/16 0900) SpO2:  [92 %-100 %] 100 % (05/16 0945) Arterial Line BP: (84-147)/(53-88) 132/72 (05/16 0945) FiO2 (%):  [70 %] 70 % (05/16 0342) Weight:  [70.9 kg (156 lb 4.9 oz)] 70.9 kg (156 lb 4.9 oz) (05/16 0500) Gen: intubated  HEENT: Normocephalic atraumatic CVS: F5-D3 heard, regular rate rhythm Respiratory: Rales scattered Abdomen: Nondistended nontender Neurological exam Patient is intubated, not sedated. No spontaneous movements noted He is breathing above the ventilator His left pupil is 4 mm, right pupil is 2 mm with no reaction Corneal reflexes absent Gag and cough are absent Absent oculocephalics No movement on noxious stimulation Mute DTRs   Medications  Current Facility-Administered Medications:  .  0.9 %  sodium chloride infusion, 250 mL, Intravenous, PRN, Irish Lack, Jayadeep S, MD .  0.9 %  sodium chloride infusion, 250 mL, Intravenous, PRN, Desai, Rahul P, PA-C .  acetaminophen (TYLENOL) tablet 650 mg, 650 mg, Oral, Q4H PRN, Jettie Booze, MD .  amiodarone (NEXTERONE PREMIX) 360-4.14 MG/200ML-% (1.8 mg/mL) IV infusion, 30 mg/hr, Intravenous, Continuous, Anders Simmonds, MD, Last Rate: 16.7 mL/hr at 11/04/17 0900, 30 mg/hr at 11/04/17 0900 .  aspirin chewable tablet 81 mg, 81 mg, Oral, Daily, Jettie Booze, MD, 81 mg at 11/04/17 0900 .  chlorhexidine (PERIDEX) 0.12 % solution 15 mL, 15 mL, Mouth Rinse, BID, Hammonds, Sharyn Blitz, MD, 15 mL at 11/04/17 0720 .  chlorhexidine  gluconate (MEDLINE KIT) (PERIDEX) 0.12 % solution 15 mL, 15 mL, Mouth Rinse, BID, Hammonds, Sharyn Blitz, MD, 15 mL at 11/03/17 2034 .  Chlorhexidine Gluconate Cloth 2 % PADS 6 each, 6 each, Topical, Daily, Tarry Kos, MD .  DOBUTamine (DOBUTREX) infusion 4000 mcg/mL, 2.5 mcg/kg/min, Intravenous, Titrated, Leonie Man, MD, Last Rate: 2.7 mL/hr at 11/04/17 0928, 2.5 mcg/kg/min at 11/04/17 0928 .  famotidine (PEPCID) IVPB 20 mg premix, 20 mg, Intravenous, Q12H, Desai, Rahul P, PA-C, Stopped at 11/04/17 0935 .  fentaNYL (SUBLIMAZE) injection 100 mcg, 100 mcg, Intravenous, Q15 min PRN, Desai, Rahul P, PA-C .  fentaNYL (SUBLIMAZE) injection 100 mcg, 100 mcg, Intravenous, Q2H PRN, Desai, Rahul P, PA-C, 100 mcg at 11/03/17 0428 .  heparin ADULT infusion 100 units/mL (25000 units/252m sodium chloride 0.45%), 1,100 Units/hr, Intravenous, Continuous, Hammonds, KSharyn Blitz MD, Last Rate: 11 mL/hr at 11/04/17 0938, 1,100 Units/hr at 11/04/17 0938 .  insulin aspart (novoLOG) injection 2-6 Units, 2-6 Units, Subcutaneous, Q4H, Hammonds, KSharyn Blitz MD, 2 Units at 11/04/17 0008 .  MEDLINE mouth rinse, 15 mL, Mouth Rinse, Q2H, Hammonds, KSharyn Blitz MD, 15 mL at 11/04/17 0906 .  metoprolol tartrate (LOPRESSOR) tablet 12.5 mg, 12.5 mg, Oral, BID, VJettie Booze MD .  norepinephrine (LEVOPHED) 16 mg in dextrose 5 % 250 mL (0.064 mg/mL) infusion, 0-40 mcg/min, Intravenous, Titrated, Hammonds, KSharyn Blitz MD, Last Rate: 14.1 mL/hr at 11/04/17 0939, 15 mcg/min at 11/04/17 0939 .  ondansetron (ZOFRAN) injection 4  mg, 4 mg, Intravenous, Q6H PRN, Larae Grooms S, MD .  piperacillin-tazobactam (ZOSYN) IVPB 3.375 g, 3.375 g, Intravenous, Q8H, Leodis Sias, RPH, Stopped at 11/04/17 0940 .  pneumococcal 23 valent vaccine (PNU-IMMUNE) injection 0.5 mL, 0.5 mL, Intramuscular, Prior to discharge, Tarry Kos, MD .  propofol (DIPRIVAN) 1000 MG/100ML infusion, 0-50 mcg/kg/min, Intravenous, Continuous,  Desai, Rahul P, PA-C, Last Rate: 2.9 mL/hr at 11/04/17 0935, 7.5 mcg/kg/min at 11/04/17 0935 .  sodium chloride flush (NS) 0.9 % injection 10-40 mL, 10-40 mL, Intracatheter, Q12H, Tarry Kos, MD, 10 mL at 11/03/17 2051 .  sodium chloride flush (NS) 0.9 % injection 10-40 mL, 10-40 mL, Intracatheter, PRN, Tarry Kos, MD .  sodium chloride flush (NS) 0.9 % injection 3 mL, 3 mL, Intravenous, Q12H, Jettie Booze, MD, 3 mL at 11/04/17 0901 .  sodium chloride flush (NS) 0.9 % injection 3 mL, 3 mL, Intravenous, PRN, Jettie Booze, MD .  ticagrelor (BRILINTA) tablet 90 mg, 90 mg, Oral, BID, Jettie Booze, MD, 90 mg at 11/04/17 0900 Labs CBC    Component Value Date/Time   WBC 28.8 (H) 11/04/2017 0438   RBC 4.25 11/04/2017 0438   HGB 12.5 (L) 11/04/2017 0438   HCT 35.7 (L) 11/04/2017 0438   PLT 85 (L) 11/04/2017 0438   MCV 84.0 11/04/2017 0438   MCH 29.4 11/04/2017 0438   MCHC 35.0 11/04/2017 0438   RDW 12.7 11/04/2017 0438   LYMPHSABS 1.6 11/05/2017 2333   MONOABS 3.1 (H) 11/07/2017 2333   EOSABS 0.0 11/12/2017 2333   BASOSABS 0.0 11/13/2017 2333    CMP     Component Value Date/Time   NA 141 11/03/2017 0453   K 4.1 11/03/2017 0453   CL 105 11/03/2017 0453   CO2 16 (L) 11/03/2017 0453   GLUCOSE 160 (H) 11/03/2017 0453   BUN 18 11/03/2017 0453   CREATININE 2.13 (H) 11/03/2017 0453   CALCIUM 7.4 (L) 11/03/2017 0453   PROT 4.8 (L) 11/03/2017 0453   ALBUMIN 2.8 (L) 11/03/2017 0453   AST 929 (H) 11/03/2017 0453   ALT 312 (H) 11/03/2017 0453   ALKPHOS 52 11/03/2017 0453   BILITOT 2.4 (H) 11/03/2017 0453   GFRNONAA 34 (L) 11/03/2017 0453   GFRAA 40 (L) 11/03/2017 0453    glycosylated hemoglobin  Lipid Panel     Component Value Date/Time   CHOL 104 11/16/2017 1903   TRIG 59 11/16/2017 2333   HDL 44 11/03/2017 1903   CHOLHDL 2.4 11/19/2017 1903   VLDL 18 11/11/2017 1903   LDLCALC 42 10/29/2017 1903     Imaging I have reviewed images in  epic and the results pertinent to this consultation are: CT-scan of the brain-blurred gray-white junction suggestive of diffuse anoxic injury.  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 EEG on presentation was consistent with burst suppression pattern.  He had myoclonus on presentation.  His clinical exam shows no brainstem function other than breathing above the vent.  He is not coughing or gagging anymore.  I suspect that he is progressing to brain death. I would like to repeat brain imaging, preferably MRI but he is too unstable at this point to obtain any imaging.  Impression: Anoxic brain injury  Recommendations: Supportive care per pCCM Given poor exam, imaging by CT on presentation, EEG findings-I do not believe that he will have a meaningful neurological recovery if  he somehow does not progress to brain death. An MRI would be helpful to ascertain the extent of anoxic injury and herniation as well but he is too sick from a cardiac standpoint to be even transported to imaging suite. A CT can be considered if he stabilizes a little more as it is a shorter study, although not as detailed as MRI, might still add some value. Discussed plan with Dr. Debbora Dus in person.  -- Amie Portland, MD [facility] Pager: [phone] If 7pm to 7am, please call on call as listed on AMION.  CRITICAL CARE ATTESTATION This patient is critically ill and at significant risk of neurological worsening, death and care requires constant monitoring of vital signs, hemodynamics,respiratory and cardiac monitoring. I spent 30  minutes of neurocritical care time performing neurological assessment, discussion with family, other specialists and medical decision making of high complexityin the care of  this patient.

## 2017-11-04 NOTE — Progress Notes (Addendum)
Progress Note  Patient Name: Scott Mcclure Date of Encounter: 11/04/2017  Primary Cardiologist: No primary care provider on file.  - new - [facility]  Subjective   On propofol drip.  Intubated/sedated.  Nonresponsive. -   Less tachypnea with less labored breathing (tachypnea likely due to metabolic acidosis   Inpatient Medications    Scheduled Meds: . aspirin  81 mg Oral Daily  . chlorhexidine  15 mL Mouth Rinse BID  . chlorhexidine gluconate (MEDLINE KIT)  15 mL Mouth Rinse BID  . Chlorhexidine Gluconate Cloth  6 each Topical Daily  . insulin aspart  2-6 Units Subcutaneous Q4H  . mouth rinse  15 mL Mouth Rinse Q2H  . metoprolol tartrate  12.5 mg Oral BID  . sodium chloride flush  10-40 mL Intracatheter Q12H  . sodium chloride flush  3 mL Intravenous Q12H  . ticagrelor  90 mg Oral BID   Continuous Infusions: . sodium chloride    . sodium chloride    . amiodarone 30 mg/hr (11/04/17 0800)  . DOBUTamine    . famotidine (PEPCID) IV Stopped (11/03/17 2111)  . heparin 1,100 Units/hr (11/04/17 0800)  . norepinephrine (LEVOPHED) Adult infusion 20.053 mcg/min (11/04/17 0800)  . piperacillin-tazobactam (ZOSYN)  IV 3.375 g (11/04/17 0600)  . propofol (DIPRIVAN) infusion 15.128 mcg/kg/min (11/04/17 0800)  .  sodium bicarbonate (isotonic) infusion in sterile water 75 mL/hr at 11/04/17 0800   PRN Meds: sodium chloride, sodium chloride, acetaminophen, fentaNYL (SUBLIMAZE) injection, fentaNYL (SUBLIMAZE) injection, ondansetron (ZOFRAN) IV, pneumococcal 23 valent vaccine, sodium chloride flush, sodium chloride flush   Vital Signs    Vitals:   11/04/17 0745 11/04/17 0800 11/04/17 0815 11/04/17 0830  BP:  129/88    Pulse: 88 91 89 89  Resp: (!) 34 (!) 34 (!) 33 (!) 33  Temp: 99.9 F (37.7 C) 99.9 F (37.7 C) 99.9 F (37.7 C) 99.9 F (37.7 C)  TempSrc:      SpO2: 100% 100% 100% 100%  Weight:      Height:        Intake/Output Summary (Last 24 hours) at 11/04/2017 0841 Last data  filed at 11/04/2017 0800 Gross per 24 hour  Intake 4023.15 ml  Output 130 ml  Net 3893.15 ml   Filed Weights   11/03/17 0500 11/03/17 0941 11/04/17 0500  Weight: 153 lb 3.5 oz (69.5 kg) 153 lb 3.5 oz (69.5 kg) 156 lb 4.9 oz (70.9 kg)    Telemetry    Sinus rhythm- Reviewed  ECG    From 5/15 (no new) Sinus rhythm, 76 bpm.  Evolving changes for inferior MI, recent- Personally Reviewed  Physical Exam   GEN:  Intubated, sedated.  Ill-appearing; no response to sternal rub Neck: No obvious JVD Cardiac:  RRR normal S1-S2.  No obvious M/R/G Respiratory:   diffuse coarse breath sounds throughout. GI:  firm with decreased bowel sounds  MS: No edema; No deformity. Neuro:   per neurology report, unresponsive.  No response to deep sternal rub; left pupil seem to be dilated and may be sluggish to fixed.  Right pupil is dilated no tracking  Labs    Chemistry Recent Labs  Lab 11/09/2017 1903 11/04/2017 1911 11/09/2017 2333 11/03/17 0453  NA 137 137 138 141  K 3.5 3.4* 3.9 4.1  CL 103 101 105 105  CO2 13*  --  16* 16*  GLUCOSE 388* 383* 248* 160*  BUN 14 15 16 18   CREATININE 1.65* 1.30* 1.95* 2.13*  CALCIUM 7.7*  --  7.4* 7.4*  PROT 5.4*  --  4.6* 4.8*  ALBUMIN 3.3*  --  2.9* 2.8*  AST 294*  --  582* 929*  ALT 134*  --  148* 312*  ALKPHOS 67  --  52 52  BILITOT 1.5*  --  1.7* 2.4*  GFRNONAA 47*  --  38* 34*  GFRAA 54*  --  44* 40*  ANIONGAP 21*  --  17* 20*     Hematology Recent Labs  Lab 11/05/2017 2333 11/03/17 0453 11/04/17 0438  WBC 39.3* 39.0* 28.8*  RBC 4.53 4.60 4.25  HGB 13.2 13.5 12.5*  HCT 39.1 40.5 35.7*  MCV 86.3 88.0 84.0  MCH 29.1 29.3 29.4  MCHC 33.8 33.3 35.0  RDW 12.9 13.2 12.7  PLT 226 187 85*    Cardiac Enzymes Recent Labs  Lab 11/11/2017 1903 10/22/2017 2333 11/03/17 0453  TROPONINI 39.49* >65.00* >65.00*   No results for input(s): TROPIPOC in the last 168 hours.   BNPNo results for input(s): BNP, PROBNP in the last 168 hours.   DDimer No  results for input(s): DDIMER in the last 168 hours.   Radiology    Ct Head Wo Contrast  Result Date: 11/03/2017 CLINICAL DATA:  52 y/o M; altered level of consciousness. History of MI and cardiac arrest. EXAM: CT HEAD WITHOUT CONTRAST TECHNIQUE: Contiguous axial images were obtained from the base of the skull through the vertex without intravenous contrast. COMPARISON:  None. FINDINGS: Brain: There is subtle blurring of gray-white differentiation and greater than expected sulcal effacement for age. Findings may represent early hypoxic ischemic injury. Follow-up CT or MRI of the brain is recommended to assess for interval change. No acute intracranial hemorrhage, focal mass effect, or herniation. Vascular: No hyperdense vessel or unexpected calcification. Skull: There are multiple sessile scalp lesions at the vertex and in the parietal regions. No acute osseous abnormality identified. Sinuses/Orbits: Opacification the right maxillary sinus with widening of the right ostiomeatal unit which may represent underlying polyp or mucocele. Patchy opacification of anterior ethmoid air cells and the right frontal sinus. Chronic inflammatory changes of the walls of right maxillary sinus. Normal aeration of mastoid air cells. Orbits are unremarkable. Nasoenteric and endotracheal tubes. Other: None. IMPRESSION: 1. Subtle blurring of gray-white differentiation and greater than expected sulcal effacement for age. Findings may represent early hypoxic ischemic injury. Consider follow-up CT or MRI of the brain to assess for interval change. 2. Right maxillary opacification and widening of the right ostiomeatal unit may represent underlying polyp or mucocele. Direct visualization recommended. These results will be called to the ordering clinician or representative by the Radiologist Assistant, and communication documented in the PACS or zVision Dashboard. Electronically Signed   By: Kristine Garbe M.D.   On: 11/03/2017  00:50   Dg Chest Port 1 View  Result Date: 11/03/2017 CLINICAL DATA:  Status post MI and cardiac arrest. Currently on intra aortic balloon pump assistance. EXAM: PORTABLE CHEST 1 VIEW COMPARISON:  Portable chest x-ray of Nov 02, 2017 FINDINGS: The lungs are well-expanded. No definite left-sided pneumothorax is visible today. There is confluent density superior medially in the left upper lobe that is fairly stable and may reflect aspiration. The interstitial markings are increased but slightly less conspicuous. The heart is normal in size. The pulmonary vascularity is mildly prominent. The intra-aortic balloon pump marker projects approximately 5 mm below the superior margin of the aortic arch. The endotracheal tube tip projects 6.4 cm above the carina. The esophagogastric tube tip and proximal  port project below the GE junction. The small caliber left chest tube remains in the apex. IMPRESSION: Persistent moderate pulmonary interstitial edema. Possible atelectasis or aspiration pneumonia in the right upper lobe. No pneumothorax. The support tubes and devices are in stable position. Electronically Signed   By: David  Martinique M.D.   On: 11/03/2017 09:52   Dg Chest Port 1 View  Result Date: 11/07/2017 CLINICAL DATA:  Left pneumothorax follow-up EXAM: PORTABLE CHEST 1 VIEW COMPARISON:  11/18/2017 FINDINGS: Endotracheal tube tip is about 5.8 cm superior to the carina. Esophageal tube tip below the diaphragm but non included. Interim placement of left-sided chest tube with tip positioned at the left lung apex. Decreased left pneumothorax with small residual left apical pneumothorax noted. Moderate right greater than left interstitial and alveolar opacity, slight decrease. Stable cardiomediastinal silhouette. Intra aortic balloon pump tip at the aortic arch. Catheter tubing coursing over the lower mediastinum with tip projecting over the left mediastinal border. IMPRESSION: 1. Placement of left-sided chest tube with  decreased left pneumothorax and tiny residual left apical pneumothorax noted. 2. Moderate right greater than left interstitial and alveolar opacity, slight decreased compared to prior. Findings could be secondary to edema, diffuse pneumonia or ARDS Electronically Signed   By: Donavan Foil M.D.   On: 10/26/2017 22:47   Dg Chest Port 1 View  Addendum Date: 10/22/2017   ADDENDUM REPORT: 10/26/2017 22:10 ADDENDUM: Dr. Jimmey Ralph aware of finding and currently placing chest tube per nurse on Nov 02, 2017 at 2205 hours. Electronically Signed   By: Elon Alas M.D.   On: 11/11/2017 22:10   Result Date: 11/01/2017 CLINICAL DATA:  Aortic balloon pump assist device. Status post cardiac arrest. EXAM: PORTABLE CHEST 1 VIEW COMPARISON:  None. FINDINGS: Aortic assist balloon pump marker projects at aortic arch. Endotracheal tube tip projects 6.2 cm above the carina. Nasogastric tube tip projects in proximal stomach. Additional catheter crosses midline tip projecting over LEFT heart. LEFT pneumothorax measuring 3.5 - 4.3 cm from the chest wall with LEFT lung atelectasis and air bronchograms. Confluent bilateral lung consolidation worse within central distribution. No pleural effusion. Cardiac silhouette is mildly enlarged, mediastinal silhouette is nonsuspicious. Soft tissue planes and included osseous structures are normal. IMPRESSION: Moderate to large LEFT pneumothorax without mediastinal shift. Multiple life-support lines including aortic balloon pump assist with marker projecting aortic arch. Dense consolidation concerning for acute pulmonary edema and/or ARDS. Mild cardiomegaly. Electronically Signed: By: Elon Alas M.D. On: 11/01/2017 22:00    Cardiac Studies    Cath-PCI 11/10/2017:  pRCA 100% -> mRCA 95% & dRCA 90% (Asp Thrombectomy & DES PCI 2 overlapping stents covering all 3 lesions -3.5 x 38 & 3.0 x 38); pLAD 90%, ostOM1 100%.  LAD-PDA and OM-RPL collaterals.  Moderately elevated LVEDP. --> IABP  placed  2D Echo 11/16/2017: To assess LV function.  Poor visibility.  Recommend Definity  Patient Profile     52 y.o. male with no prior cardiac history who was taken to the Cath Lab emergently on the evening of 11/15/2017 by Dr. Irish Lack following a prolonged cardiac arrest of roughly 40 minutes.  4 shocks and CPR for 10 minutes.  Post ROSC EKG demonstrated inferior ST elevations. He was found to have a totally occluded RCA with extensive disease beyond the occlusion as well as diffuse multivessel disease. He was also noted to be cardiogenic shock.  Dr. Irish Lack did extensive thrombectomy and PTCA and PCI of most of the circumflex using 2 long stents.  Due to  cardiac shock, after discussion interventional colleagues, he felt based on the patient's illness level he would place a balloon pump as opposed to Impella.  Balloon pump remains 1: 1, and pressors have been titrated up as opposed to down.  Is now on levophed, vasopressin as well as epinephrine drip.  Assessment & Plan    Principal Problem:   Acute inferior myocardial infarction [facility]) Active Problems:   Cardiac arrest ([place])   Pressure injury of skin   Cardiogenic shock ([facility])   Multiple vessel coronary artery disease   Presence of drug coated stent in right coronary artery   Lactic acidosis   Anoxic brain injury ([place])  Unfortunately Scott Mcclure appears to have severe anoxic brain injury.  Emergent extensive PCI to the RCA with significant residual CAD remaining.  We have been able to wean down the Epinephrine and Levophed drips, and his cardiac output is picked up reticulocyte index previously being less than 2 now 2.8.  (Cardiac output 4.9) PA pressures are relatively normal.  Plan for now Is to continue to pressor support --> an effort to potentially wean balloon pump..  I do not expect that we w be ready to wean tomorrow.  Wean off epinephrine drip and start dobutamine 2.5   Once off epinephrine, gradually wean Levophed.  Down to  roughly 5, will then start to wean while on dobutamine-continue   Continue heparin drip for balloon pump  Continue Brilinta   could consider adding high-dose statin  He remains on amiodarone and has had no further arrhythmias -> can likely convert to per OGT tomorrow if stable  Limited echo with contrast ordered to better assess EF  He has been made DNR/DNI but the family, however he still remains on significant vasopressor and balloon pump support.  Twice daily of not escalating therapy, we will continue to wean off pressor support and potentially try to wean off the balloon pump.  Unfortunately it appears that he likely will not have any neurologic recovery.  We will need to be seen in the next 24 to 48 hours his amount of recovery he would need maximum hemodynamic support possible to allow for cerebral perfusion.  With lactic acidosis, And lack of neurologic response from with significant lactic acidosis  Urine output continues to be very very low, suggest acute renal insufficiency secondary to ATN even potentially CIN. -->  Adding to his poor prognosis.  We will follow along these assistance.   Patient is critically ill with multiple organ system failure.  Critical care time 1 hour    For questions or updates, please contact [place] Please consult [email or web address] for contact info under Cardiology/STEMI.      Signed, Glenetta Hew, MD  11/04/2017, 8:41 AM

## 2017-11-04 NOTE — Progress Notes (Signed)
eLink Physician-Brief Progress Note Patient Name: Scott Mcclure DOB: 1965-08-08 MRN: [id number]   Date of Service  11/04/2017  HPI/Events of Note  HR 148 sinsus and wide on monitor. On amio and on dobutamine gtt. BP very high  eICU Interventions  Dc dobutamine reasess in 15-30 minutes     Intervention Category Major Interventions: Arrhythmia - evaluation and management  Rommie Dunn 11/04/2017, 8:22 PM

## 2017-11-04 NOTE — Progress Notes (Signed)
PULMONARY / CRITICAL CARE MEDICINE   Name: Scott Mcclure MRN: [id number] DOB: 1965/09/14    ADMISSION DATE:  11/16/2017 CONSULTATION DATE: 16-Nov-2017  REFERRING MD: Dr Eldridge Dace  CHIEF COMPLAINT: STEMI, Cardiac arrest   HISTORY OF PRESENT ILLNESS:   51yoM with hx Tobacco abuse, presented after having a witnessed out-of-hospital cardiac arrest. According to family, patient has been c/o CP for the past 1 year. Then tonight he had a witnessed cardiac arrest, with bystander CPR initiated. EMS was called and on their arrival found patient to be in VFib. He required 40 minutes of CPR prior to achieving ROSC. On arrival to the ER patient had again lost a pulse, this time with VT cardiac arrest. He was shocked multiple times on his way to the cath lab. In the cath lab patient was noted to be having decorticate type posturing of his arms. During the cath patient began having copious bleeding from his ETT. Cath found diffuse coronary lesions, most severe in RCA, requiring multiple stents. IABP and Theone Murdoch placed. On arrival from cath lab to ICU patient noted to have eyes rolled up in his head and having questionable seizure vs myoclonus activity in his shoulders. Neurology called and is at bedside now.   5/14 The patient is is presently in the ICU. He is on the ventialtor. He has a IABP in place with augmnented pressures of 100-110. The patient is requiring high doses of vasopressors. EEG done overnight suggested significant brain injury. The patient has a small left chest tube as he developed a PTX overnight. He has a Swan ganz catheter in the right groin.  5/16 The patient remains sedated tioin the ICU on the ventialtor. His pressor requirements have gone down and hsi cardiac putput up. He still has poor urine output. He does not move. He does trigger the ventialtor. The significant acidosis appears to have resolved.Will stop the bicarb infusions  PAST MEDICAL HISTORY :  He  has a past medical  history of Cardiac arrest (HCC) (Nov 16, 2017) and Myocardial infarct (HCC) (11-16-2017).  PAST SURGICAL HISTORY: He  has a past surgical history that includes Coronary/Graft Acute MI Revascularization (N/A, 16-Nov-2017); LEFT HEART CATH AND CORONARY ANGIOGRAPHY (N/A, Nov 16, 2017); IABP Insertion (N/A, 2017/11/16); and RIGHT HEART CATH (N/A, 2017/11/16).    No Known Allergies  No current facility-administered medications on file prior to encounter.    No current outpatient medications on file prior to encounter.   FAMILY HISTORY:  His has no family status information on file.   SOCIAL HISTORY: He  reports that he has been smoking cigarettes.  He has been smoking about 1.00 pack per day. He has never used smokeless tobacco.is an active smoker. Per patient's mother and brother, he smokes marijuana but no other illicit drugs. No Etoh use. Patient lives with his mother in her house; a friend also lives in the house with the patient's mother.      VITAL SIGNS: BP 129/88   Pulse 90   Temp 100 F (37.8 C)   Resp (!) 34   Ht 5\' 6"  (1.676 m)   Wt 156 lb 4.9 oz (70.9 kg)   SpO2 100%   BMI 25.23 kg/m   HEMODYNAMICS: PAP: (32-46)/(14-29) 33/19 CVP:  [28 mmHg] 28 mmHg PCWP:  [18 mmHg] 18 mmHg CO:  [2.3 L/min-5 L/min] 5 L/min CI:  [1.2 L/min/m2-2.8 L/min/m2] 2.8 L/min/m2 SBP 120's on no vasopressors  VENTILATOR SETTINGS: Vent Mode: PCV FiO2 (%):  [70 %] 70 % Set Rate:  [12 bmp]  12 bmp PEEP:  [8 cmH20] 8 cmH20 Plateau Pressure:  [19 cmH20-20 cmH20] 19 cmH20  INTAKE / OUTPUT: I/O last 3 completed shifts: In: 5422.7 [I.V.:5272.7; IV Piggyback:150] Out: 615 [Urine:435; Emesis/NG output:100; Other:50; Chest Tube:30]  PHYSICAL EXAMINATION: General: Thin adult male, intubated and sedated, critically ill Neuro: pupils pinpoint, eyes both deviated upward, no response to sternal rub or nail bed pressure. No spontaneous movements of any extremities at time of my exam. Intermittent muscle contractures  in neck and shoulders that are occurring at regular intervals (?seizure). Reportedly had decorticate movements earlier this evening prior to cath. HEENT: Orally intubated. ETT with copious bright red blood being suctioned Cardiovascular: RRR no m/r/g Lungs: Rhonchi bilaterally, pectus excavatum chest wall deformity, Abrasions to mid-sternum from CPR.  Abdomen: Soft NTND Musculoskeletal: no LE edema  Skin: abrasions to sternal as described above.   LABS:  BMET Recent Labs  Lab 11-26-17 1903 Nov 26, 2017 1911 11-26-17 2333 11/03/17 0453  NA 137 137 138 141  K 3.5 3.4* 3.9 4.1  CL 103 101 105 105  CO2 13*  --  16* 16*  BUN 14 15 16 18   CREATININE 1.65* 1.30* 1.95* 2.13*  GLUCOSE 388* 383* 248* 160*   Electrolytes Recent Labs  Lab 11-26-2017 1903 Nov 26, 2017 2333 11/03/17 0453  CALCIUM 7.7* 7.4* 7.4*  MG  --  1.8 1.8  PHOS  --  4.3 4.6   CBC Recent Labs  Lab 2017/11/26 2333 11/03/17 0453 11/04/17 0438  WBC 39.3* 39.0* 28.8*  HGB 13.2 13.5 12.5*  HCT 39.1 40.5 35.7*  PLT 226 187 85*   Coag's Recent Labs  Lab 11-26-2017 1903 26-Nov-2017 2333 11/04/17 0438  APTT 35  --   --   INR 1.45 1.89 2.74   Sepsis Markers Recent Labs  Lab 26-Nov-2017 2333 11/03/17 1056 11/03/17 1540 11/04/17 0438  LATICACIDVEN 8.4*  --  14.1*  --   PROCALCITON 2.59 13.86  --  29.15    ABG Recent Labs  Lab 11/03/17 0133 11/03/17 0910 11/04/17 0914  PHART 7.286* 7.377 7.524*  PCO2ART 27.9* 27.2* 37.6  PO2ART 71.0* 95.0 165.0*    Liver Enzymes Recent Labs  Lab 11/26/2017 1903 11-26-2017 2333 11/03/17 0453  AST 294* 582* 929*  ALT 134* 148* 312*  ALKPHOS 67 52 52  BILITOT 1.5* 1.7* 2.4*  ALBUMIN 3.3* 2.9* 2.8*    Cardiac Enzymes Recent Labs  Lab 11-26-2017 1903 2017/11/26 2333 11/03/17 0453  TROPONINI 39.49* >65.00* >65.00*   Glucose Recent Labs  Lab 11/03/17 1059 11/03/17 1533 11/03/17 2037 11/04/17 0005 11/04/17 0316 11/04/17 0723  GLUCAP 105* 200* 214* 148* 118* 116*     Imaging No results found.  STUDIES:  CXR (5/14): diffuse bilateral pulmonary infiltrates; moderate to large left-sided pneumothorax.  Head CT (5/14): pending EEG (5/14): pending   CULTURES: Sputum culture (5/14): pending   ANTIBIOTICS: Zosyn 5/14 >>  SIGNIFICANT EVENTS: 5/14: presented to ER s/p out-of-hospital VF/VT cardiac arrest, taken to cath lab with multiple DES placed, significant bleeding from ETT so not cooled, question of seizure. Neurology consulted. Left-sided pneumothorax presumably related to CPR; chest tube placed.   LINES/TUBES: IABP 5/14 >> Swan Ganz catheter 5/14 >> LLE IO 5/14 >> ETT 5/14 >> NG tube 5/14 >>  DISCUSSION: 51yoM with hx Tobacco abuse, presented to ER s/p out-of-hospital VF/VT cardiac arrest, taken to cath lab with multiple DES placed, significant bleeding from ETT so not cooled, question of seizure. Neurology consulted. Left-sided pneumothorax presumably related to CPR; chest tube placed.  ASSESSMENT / PLAN:   PULMONARY 1. Acute hypoxic and hypercapneic respiratory failure; Pulmonary hemorrhage; Pulmonary infiltrates (aspiration pneumonia vs aspirated blood); Pneumothorax: - CXR on my review shows a moderate-to-large left-sided pneumothorax, for which a left-sided pigtail chest tube was emergently placed. Repeat CXR on my review shows left lung is now re-inflated with Chest tube in appropriate position. Chest tube placed to suction.   - ABG (prior to chest tube) showed combination metabolic and respiratory acidosis. Patient at that time was on AC/PC setting but was breathing at RR 35, well over set rate, with minute ventilation of 23. Gave 2 amps Bicarb.  - Repeat ABG now post-chest tube placement; place A-line.  - CXR also shows diffuse pulmonary infiltrates bilaterally. Unclear if this represents aspirated blood in the setting of acute pulmonary hemorrhage (suctioning copious frank blood from ETT) versus if this is aspiration pneumonia.    5/14 The patient is currently requiring 70% FI02. He is on Pressure control Ventialtion with an Inspiratory pressure of 10 and PEEP of 8.He is no longer having copious blood from the ET tube. The CXR shows  A right mid lung density and perhaps congestive changes bilaterally. The left lung has reexpanded post-CT placement. 5/15 ABG shows abit of a metabolic alkalois so we will stop the bicarb infusion. The patient can likely have his PEEP and FI02 taken down as he has an 02 sat of 100% presently on those settings.   CARDIOVASCULAR 1. 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 Multiple DES's placed to RCA where heavier disease burden lay.  - IABP and Swan Ganz placed.  - TTE ordered. - Not initiating hypothermia protocol given the large volume of frank blood being suctioned from ETT. - Continue Cangrelor for 8 hours post Brilinta 180 mg via OG tube per Cardiology rec's - Start Statin 5/14 The patient is requiring large doses of pressors. He is on Vasopressin of o.o3 units/minand over 30 mcg of levophed. His recent PCWP awas aboput 14. His cardiac output was about 2.2 This does not augus well for the patient The Ernestine Conrad ganz tip is at the level of thel eft pA. The tip of the balloon pump appears to be in proper position. 5/16  No majorarrhythmias nnoted. Cardiac fn. Remarkably better with new CI about 2.4/. The ppressor  requirents have bee tapered as well. The aotent remains on about 20 mcg of levophed, no vasoporessin and was just taken off Epinephrine. Cardiology had request a small dose of dobutamine at this time. The patient remains on the IABP at 1:1. Peak tropopnin was > 65 on 5.15. CXR is pending  RENAL 1. AKI: - creatinine 1.65 with no known prior baseline; presumed this is all acute injury, likely related to ATN in setting of the prolonged arrest - foley catheter placed; UA  obtained. Add urine lytes as well.  - monitor UOP; avoid nephrotoxic agents. 5/14 Urine output appears to be poor as expected given the profound shock. There does not appear to be an immminent need now for RRT.    2. Hypokalemia: - K 3.5; replete with KCL IV  GASTROINTESTINAL 1. Shock liver:  - transaminitis likely related to shock liver from the cardiac arrest. Continue to trend LFT's daily. - NPO; GI prophylaxis.   HEMATOLOGIC No active issues   INFECTIOUS 1. Aspiration pneumonia: - check sputum culture, procal, lactate; start Zosyn  ENDOCRINE 1. Hyperglycemia: - no hx of DM; NPO;  SSI  NEUROLOGIC 1. Questionable seizure vs Myoclonus; Anoxic Encephalopathy: - Neurology consulted - EEG ordered - Anti-epileptic drugs per Neurology.  I discussed the case with neurology. The aotient has some anisocoria tod ay. The left pupil is about 3mm and theright 1.5 whic his new. The aoptient does not arouse or move. Neurology is oconcerned because even on their initial exam there appeared to be evidence of brainstem dysfn, clinical myoclonus and burst suppressionof AEDS. While it would be nice to image the patient we do niot want to move himpresently out of the unit  FAMILY  - Updates: updated patient's mother and brother at bedside.  - Inter-disciplinary family meet or Palliative Care meeting due by: 11/08/17  Criticalcare time 45 minutes   Jamesetta So MD [facility] criticalcare 11/04/2017, 9:19 AM

## 2017-11-04 NOTE — Progress Notes (Signed)
ANTICOAGULATION CONSULT NOTE   Pharmacy Consult for heparin  Indication: IABP  No Known Allergies  Patient Measurements: Height: _0  (167.6 cm) Weight: 156 lb 4.9 oz (70.9 kg) IBW/kg (Calculated) : 63.8   Vital Signs: Temp: 99.7 F (37.6 C) (05/16 0500) BP: 140/94 (05/16 0500) Pulse Rate: 89 (05/16 0500)  Labs: Recent Labs    11/14/2017 1903 10/20/2017 1911 11/03/2017 2333 11/03/17 0453 11/03/17 1722 11/04/17 0438  HGB 14.9 15.3 13.2 13.5  --  12.5*  HCT 44.8 45.0 39.1 40.5  --  35.7*  PLT 228  --  226 187  --  PENDING  APTT 35  --   --   --   --   --   LABPROT 17.5*  --  21.5*  --   --  28.8*  INR 1.45  --  1.89  --   --  2.74  HEPARINUNFRC  --   --   --   --  <0.10* <0.10*  CREATININE 1.65* 1.30* 1.95* 2.13*  --   --   TROPONINI 39.49*  --  >65.00* >65.00*  --   --     Estimated Creatinine Clearance: 37 mL/min (A) (by C-G formula based on SCr of 2.13 mg/dL (H)).   Medical History: Past Medical History:  Diagnosis Date  . Cardiac arrest ([place]) 11/07/2017  . Myocardial infarct ([place]) 10/25/2017    Medications:  No medications prior to admission.   Scheduled:  . aspirin  81 mg Oral Daily  . chlorhexidine  15 mL Mouth Rinse BID  . chlorhexidine gluconate (MEDLINE KIT)  15 mL Mouth Rinse BID  . Chlorhexidine Gluconate Cloth  6 each Topical Daily  . insulin aspart  2-6 Units Subcutaneous Q4H  . mouth rinse  15 mL Mouth Rinse Q2H  . metoprolol tartrate  12.5 mg Oral BID  . sodium chloride flush  10-40 mL Intracatheter Q12H  . sodium chloride flush  3 mL Intravenous Q12H  . ticagrelor  90 mg Oral BID    Assessment:  52 y.o. male admitted on 11/16/2017 with cardiac arrest with V-tach/V-fib, s/p Cath with IABP placement. Pharmacy consulted to dose heparin   Heparin level remains undetectable on 950 units/hr.  INR is 2.74 likely due to shock  Goal of Therapy:  Heparin level 0.2-0.5 units/ml Monitor platelets by anticoagulation protocol: Yes   Plan:  -Increase  heparin rate to 1100 units/hr -Heparin level in 8 hours  Excell Seltzer, PharmD Clinical Pharmacist   11/04/2017 6:06 AM

## 2017-11-04 NOTE — Progress Notes (Signed)
Pharmacy Antibiotic Note  Scott Mcclure is a 52 y.o. male admitted on 11-08-17 with cardiac arrest with V-tach/V-fib, s/p Cath with IABP placement. CCM is also following, pt with pneumothorax, chest tube placed. Pharmacy has been consulted for zosyn dosing for possible aspiration pneumonia.  WBC elevated, 28.8, febrile, significant renal decline, Scr 2.13>5.12, lactic acid improving.  Plan: Zosyn 2.25g IV q6h Monitor clinic progress, WBC, TMax, renal function, electrolytes F/u future de-escalation, & length of therapy  Height: 5\' 6"  (167.6 cm) Weight: 156 lb 4.9 oz (70.9 kg) IBW/kg (Calculated) : 63.8  Temp (24hrs), Avg:99.6 F (37.6 C), Min:98.8 F (37.1 C), Max:100.4 F (38 C)  Recent Labs  Lab 2017/11/08 1903 Nov 08, 2017 1911 Nov 08, 2017 2333 11/03/17 0453 11/03/17 1540 11/04/17 0438 11/04/17 0758 11/04/17 0908  WBC 33.5*  --  39.3* 39.0*  --  28.8*  --   --   CREATININE 1.65* 1.30* 1.95* 2.13*  --   --   --  5.12*  LATICACIDVEN  --   --  8.4*  --  14.1*  --  2.3*  --     Estimated Creatinine Clearance: 15.4 mL/min (A) (by C-G formula based on SCr of 5.12 mg/dL (H)).    No Known Allergies  Antimicrobials this admission: Zosyn 5/14 >>   Dose adjustments this admission: 5/16: Zosyn 3.375g q8h to 2.25g IV q6h  Microbiology results:   5/14 Sputum:    Thank you for allowing pharmacy to be a part of this patient's care.  Shayden Gingrich 11/04/2017 2:37 PM

## 2017-11-04 NOTE — Progress Notes (Signed)
eLink Physician-Brief Progress Note Patient Name: Nyko Altamirano DOB: January 03, 1966 MRN: [id number]   Date of Service  11/04/2017  HPI/Events of Note  Poor Ve  eICU Interventions  increae Pip 10 to 15, continue peep 8 Stat cxr     Intervention Category Major Interventions: Other:  Kalman Shan 11/04/2017, 8:55 PM

## 2017-11-04 NOTE — Progress Notes (Signed)
  Echocardiogram 2D Echocardiogram has been performed.  Scott Mcclure 11/04/2017, 1:53 PM

## 2017-11-05 DIAGNOSIS — I2119 ST elevation (STEMI) myocardial infarction involving other coronary artery of inferior wall: Secondary | ICD-10-CM

## 2017-11-05 LAB — POCT I-STAT 3, ART BLOOD GAS (G3+)
ACID-BASE EXCESS: 1 mmol/L (ref 0.0–2.0)
ACID-BASE EXCESS: 3 mmol/L — AB (ref 0.0–2.0)
Acid-Base Excess: 3 mmol/L — ABNORMAL HIGH (ref 0.0–2.0)
Bicarbonate: 29 mmol/L — ABNORMAL HIGH (ref 20.0–28.0)
Bicarbonate: 30.3 mmol/L — ABNORMAL HIGH (ref 20.0–28.0)
Bicarbonate: 31.7 mmol/L — ABNORMAL HIGH (ref 20.0–28.0)
O2 SAT: 100 %
O2 SAT: 100 %
O2 Saturation: 100 %
PCO2 ART: 50.3 mmHg — AB (ref 32.0–48.0)
PCO2 ART: 54.5 mmHg — AB (ref 32.0–48.0)
PCO2 ART: 87.7 mmHg — AB (ref 32.0–48.0)
PH ART: 7.366 (ref 7.350–7.450)
PH ART: 7.161 — AB (ref 7.350–7.450)
PH ART: 7.349 — AB (ref 7.350–7.450)
PO2 ART: 210 mmHg — AB (ref 83.0–108.0)
PO2 ART: 433 mmHg — AB (ref 83.0–108.0)
Patient temperature: 36.2
Patient temperature: 36
Patient temperature: 97.2
TCO2: 31 mmol/L (ref 22–32)
TCO2: 32 mmol/L (ref 22–32)
TCO2: 34 mmol/L — ABNORMAL HIGH (ref 22–32)
pO2, Arterial: 360 mmHg — ABNORMAL HIGH (ref 83.0–108.0)

## 2017-11-05 LAB — HEPATIC FUNCTION PANEL
ALBUMIN: 2.5 g/dL — AB (ref 3.5–5.0)
ALK PHOS: 63 U/L (ref 38–126)
ALT: 3678 U/L — AB (ref 17–63)
AST: 3145 U/L — AB (ref 15–41)
BILIRUBIN TOTAL: 3.6 mg/dL — AB (ref 0.3–1.2)
Bilirubin, Direct: 2 mg/dL — ABNORMAL HIGH (ref 0.1–0.5)
Indirect Bilirubin: 1.6 mg/dL — ABNORMAL HIGH (ref 0.3–0.9)
Total Protein: 4.6 g/dL — ABNORMAL LOW (ref 6.5–8.1)

## 2017-11-05 LAB — BASIC METABOLIC PANEL
ANION GAP: 20 — AB (ref 5–15)
BUN: 79 mg/dL — ABNORMAL HIGH (ref 6–20)
CALCIUM: 6 mg/dL — AB (ref 8.9–10.3)
CHLORIDE: 89 mmol/L — AB (ref 101–111)
CO2: 26 mmol/L (ref 22–32)
Creatinine, Ser: 7.74 mg/dL — ABNORMAL HIGH (ref 0.61–1.24)
GFR calc non Af Amer: 7 mL/min — ABNORMAL LOW (ref >60)
GFR, EST AFRICAN AMERICAN: 8 mL/min — AB (ref >60)
Glucose, Bld: 122 mg/dL — ABNORMAL HIGH (ref 65–99)
POTASSIUM: 4.8 mmol/L (ref 3.5–5.1)
Sodium: 135 mmol/L (ref 135–145)

## 2017-11-05 LAB — CBC
HEMATOCRIT: 32.6 % — AB (ref 39.0–52.0)
Hemoglobin: 11.3 g/dL — ABNORMAL LOW (ref 13.0–17.0)
MCH: 30 pg (ref 26.0–34.0)
MCHC: 34.7 g/dL (ref 30.0–36.0)
MCV: 86.5 fL (ref 78.0–100.0)
Platelets: 68 10*3/uL — ABNORMAL LOW (ref 150–400)
RBC: 3.77 MIL/uL — ABNORMAL LOW (ref 4.22–5.81)
RDW: 12.7 % (ref 11.5–15.5)
WBC: 19.7 10*3/uL — ABNORMAL HIGH (ref 4.0–10.5)

## 2017-11-05 LAB — HEPARIN LEVEL (UNFRACTIONATED)
HEPARIN UNFRACTIONATED: 0.38 [IU]/mL (ref 0.30–0.70)
Heparin Unfractionated: 0.31 IU/mL (ref 0.30–0.70)

## 2017-11-05 LAB — GLUCOSE, CAPILLARY
GLUCOSE-CAPILLARY: 104 mg/dL — AB (ref 65–99)
Glucose-Capillary: 104 mg/dL — ABNORMAL HIGH (ref 65–99)
Glucose-Capillary: 105 mg/dL — ABNORMAL HIGH (ref 65–99)
Glucose-Capillary: 114 mg/dL — ABNORMAL HIGH (ref 65–99)
Glucose-Capillary: 121 mg/dL — ABNORMAL HIGH (ref 65–99)
Glucose-Capillary: 137 mg/dL — ABNORMAL HIGH (ref 65–99)

## 2017-11-05 LAB — PROTIME-INR
INR: 1.96
Prothrombin Time: 22.2 seconds — ABNORMAL HIGH (ref 11.4–15.2)

## 2017-11-05 MED ORDER — MORPHINE BOLUS VIA INFUSION
5.0000 mg | INTRAVENOUS | Status: DC | PRN
  Administered 2017-11-05: 10 mg via INTRAVENOUS
  Filled 2017-11-05: qty 20

## 2017-11-05 MED ORDER — SODIUM CHLORIDE 0.9% FLUSH: 10.0000 mL | INTRAVENOUS | Status: DC | PRN

## 2017-11-05 MED ORDER — SODIUM CHLORIDE 0.9% FLUSH
10.0000 mL | Freq: Two times a day (BID) | INTRAVENOUS | Status: DC
  Administered 2017-11-05 (×2): 10 mL

## 2017-11-05 MED ORDER — CHLORHEXIDINE GLUCONATE CLOTH 2 % EX PADS
6.0000 | MEDICATED_PAD | Freq: Every day | CUTANEOUS | Status: DC
  Administered 2017-11-05: 6 via TOPICAL

## 2017-11-05 MED ORDER — MORPHINE 100MG IN NS 100ML (1MG/ML) PREMIX INFUSION
10.0000 mg/h | INTRAVENOUS | Status: DC
  Administered 2017-11-05: 10 mg/h via INTRAVENOUS
  Filled 2017-11-05: qty 100

## 2017-11-05 NOTE — Progress Notes (Signed)
Performed apnea test per protocol with MD present.  Confirmed results with MD.

## 2017-11-05 NOTE — Progress Notes (Addendum)
Neurology Progress Note   S:// Seen and examined. Pupils were unequal and non reactive yesterday. They are equal, dilated and fixed today Had bouts of hypotension and hypertension overnight.  Continues to be on balloon pump  O:// Current vital signs: BP (!) 127/47   Pulse 78   Temp (!) 96.8 F (36 C)   Resp 12   Ht 5' 6"  (1.676 m)   Wt 73.8 kg (162 lb 11.2 oz)   SpO2 100%   BMI 26.26 kg/m  Vital signs in last 24 hours: Temp:  [96.8 F (36 C)-101.5 F (38.6 C)] 96.8 F (36 C) (05/17 0829) Pulse Rate:  [75-136] 78 (05/17 0757) Resp:  [0-35] 12 (05/17 0757) BP: (86-161)/(47-98) 127/47 (05/17 0757) SpO2:  [90 %-100 %] 100 % (05/17 0757) Arterial Line BP: (83-152)/(53-85) 125/70 (05/17 0600) FiO2 (%):  [60 %] 60 % (05/17 0757) Weight:  [73.8 kg (162 lb 11.2 oz)] 73.8 kg (162 lb 11.2 oz) (05/17 0600) General exam: Intubated, no sedation HEENT: Normocephalic atraumatic CVS: G9-F6 heard, regular rate rhythm Respiratory: Scattered rales Abdomen: Nondistended nontender Neurological exam Patient is intubated, not on any sedation He is not breathing over the ventilator His pupils are 5 mm bilaterally, fixed, round He has no spontaneous movements Oculocephalics are absent Corneal reflexes are absent bilaterally Gag and cough are absent per nursing Does not withdrawal or show any movement to noxious stimulus DTRs are mute  Medications  Current Facility-Administered Medications:  .  0.9 %  sodium chloride infusion, 250 mL, Intravenous, PRN, Larae Grooms S, MD .  0.9 %  sodium chloride infusion, 250 mL, Intravenous, PRN, Shearon Stalls, Rahul P, PA-C, Stopped at 11/04/17 1737 .  0.9 %  sodium chloride infusion, , Intravenous, Continuous, Tarry Kos, MD, Last Rate: 20 mL/hr at 11/05/17 0600 .  acetaminophen (TYLENOL) tablet 650 mg, 650 mg, Oral, Q4H PRN, Jettie Booze, MD .  amiodarone (NEXTERONE PREMIX) 360-4.14 MG/200ML-% (1.8 mg/mL) IV infusion, 30 mg/hr,  Intravenous, Continuous, Anders Simmonds, MD, Last Rate: 16.7 mL/hr at 11/05/17 0600, 30 mg/hr at 11/05/17 0600 .  aspirin chewable tablet 81 mg, 81 mg, Oral, Daily, Jettie Booze, MD, 81 mg at 11/04/17 0900 .  chlorhexidine (PERIDEX) 0.12 % solution 15 mL, 15 mL, Mouth Rinse, BID, Hammonds, Sharyn Blitz, MD, 15 mL at 11/04/17 0720 .  chlorhexidine gluconate (MEDLINE KIT) (PERIDEX) 0.12 % solution 15 mL, 15 mL, Mouth Rinse, BID, Hammonds, Sharyn Blitz, MD, 15 mL at 11/04/17 1959 .  Chlorhexidine Gluconate Cloth 2 % PADS 6 each, 6 each, Topical, Daily, Tarry Kos, MD, 6 each at 11/05/17 [phone] .  Chlorhexidine Gluconate Cloth 2 % PADS 6 each, 6 each, Topical, Daily, Hammonds, Sharyn Blitz, MD .  famotidine (PEPCID) IVPB 20 mg premix, 20 mg, Intravenous, Q24H, Kris Mouton, RPH .  fentaNYL (SUBLIMAZE) injection 100 mcg, 100 mcg, Intravenous, Q15 min PRN, Desai, Rahul P, PA-C .  fentaNYL (SUBLIMAZE) injection 100 mcg, 100 mcg, Intravenous, Q2H PRN, Shearon Stalls, Rahul P, PA-C, 100 mcg at 11/04/17 2012 .  heparin ADULT infusion 100 units/mL (25000 units/262m sodium chloride 0.45%), 1,200 Units/hr, Intravenous, Continuous, Hammonds, KSharyn Blitz MD, Last Rate: 12 mL/hr at 11/05/17 0600, 1,200 Units/hr at 11/05/17 0600 .  insulin aspart (novoLOG) injection 2-6 Units, 2-6 Units, Subcutaneous, Q4H, Hammonds, KSharyn Blitz MD, 2 Units at 11/05/17 0445 .  MEDLINE mouth rinse, 15 mL, Mouth Rinse, Q2H, Hammonds, KSharyn Blitz MD, 15 mL at 11/05/17 0609 .  metoprolol tartrate (LOPRESSOR) tablet 12.5  mg, 12.5 mg, Oral, BID, Jettie Booze, MD .  norepinephrine (LEVOPHED) 16 mg in dextrose 5 % 250 mL (0.064 mg/mL) infusion, 0-40 mcg/min, Intravenous, Titrated, Hammonds, Sharyn Blitz, MD, Last Rate: 9.4 mL/hr at 11/05/17 0600, 10 mcg/min at 11/05/17 0600 .  ondansetron (ZOFRAN) injection 4 mg, 4 mg, Intravenous, Q6H PRN, Larae Grooms S, MD .  piperacillin-tazobactam (ZOSYN) IVPB 2.25 g, 2.25 g,  Intravenous, Q6H, Hammonds, Sharyn Blitz, MD, Stopped at 11/05/17 [phone] .  pneumococcal 23 valent vaccine (PNU-IMMUNE) injection 0.5 mL, 0.5 mL, Intramuscular, Prior to discharge, Tarry Kos, MD .  propofol (DIPRIVAN) 1000 MG/100ML infusion, 0-50 mcg/kg/min, Intravenous, Continuous, Desai, Rahul P, PA-C, Stopped at 11/04/17 1037 .  sodium chloride flush (NS) 0.9 % injection 10-40 mL, 10-40 mL, Intracatheter, Q12H, Tarry Kos, MD, 10 mL at 11/04/17 2210 .  sodium chloride flush (NS) 0.9 % injection 10-40 mL, 10-40 mL, Intracatheter, PRN, Tarry Kos, MD .  sodium chloride flush (NS) 0.9 % injection 10-40 mL, 10-40 mL, Intracatheter, Q12H, Hammonds, Sharyn Blitz, MD .  sodium chloride flush (NS) 0.9 % injection 10-40 mL, 10-40 mL, Intracatheter, PRN, Hammonds, Sharyn Blitz, MD .  sodium chloride flush (NS) 0.9 % injection 3 mL, 3 mL, Intravenous, Q12H, Jettie Booze, MD, 3 mL at 11/04/17 2211 .  sodium chloride flush (NS) 0.9 % injection 3 mL, 3 mL, Intravenous, PRN, Jettie Booze, MD .  ticagrelor [facility]) tablet 90 mg, 90 mg, Oral, BID, Jettie Booze, MD, 90 mg at 11/04/17 2209 Labs CBC    Component Value Date/Time   WBC 19.7 (H) 11/05/2017 0500   RBC 3.77 (L) 11/05/2017 0500   HGB 11.3 (L) 11/05/2017 0500   HCT 32.6 (L) 11/05/2017 0500   PLT 68 (L) 11/05/2017 0500   MCV 86.5 11/05/2017 0500   MCH 30.0 11/05/2017 0500   MCHC 34.7 11/05/2017 0500   RDW 12.7 11/05/2017 0500   LYMPHSABS 1.6 10/25/2017 2333   MONOABS 3.1 (H) 11/05/2017 2333   EOSABS 0.0 11/03/2017 2333   BASOSABS 0.0 11/18/2017 2333    CMP     Component Value Date/Time   NA 135 11/05/2017 0646   K 4.8 11/05/2017 0646   CL 89 (L) 11/05/2017 0646   CO2 26 11/05/2017 0646   GLUCOSE 122 (H) 11/05/2017 0646   BUN 79 (H) 11/05/2017 0646   CREATININE 7.74 (H) 11/05/2017 0646   CALCIUM 6.0 (LL) 11/05/2017 0646   PROT 4.8 (L) 11/03/2017 0453   ALBUMIN 2.8 (L) 11/03/2017 0453    AST 929 (H) 11/03/2017 0453   ALT 312 (H) 11/03/2017 0453   ALKPHOS 52 11/03/2017 0453   BILITOT 2.4 (H) 11/03/2017 0453   GFRNONAA 7 (L) 11/05/2017 0646   GFRAA 8 (L) 11/05/2017 0646   Imaging I have reviewed images in epic and the results pertinent to this consultation are: Noncontrast CT of the head showed blurred gray-white junction suggestive of diffuse anoxic injury.  Patient is unable to go for an MRI because he is on multiple drips at this time to maintain his blood pressures.  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 EEG on presentation was consistent with burst suppression pattern.  He had myoclonus on presentation.  Up until yesterday, he is breathing above the vent but since last night, he is not. He has no other brainstem reflexes present. His clinical exam is consistent with that  of brain death.  I would recommend confirmation with an apnea test. An imaging modality such as MRI would have been helpful to ascertain the extent of anoxic injury but due to his unstable condition from a cardiac standpoint, he is unable to get one.  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 Even if the test were to be negative, the prognosis in terms of a neurologically meaningful recovery are grim. The plan was relayed to the patient's RN and attending physician at bedside.   Please call with questions  -- Amie Portland, MD [facility] Pager: [phone] If 7pm to 7am, please call on call as listed on AMION.  CRITICAL CARE ATTESTATION This patient is critically ill and at significant risk of neurological worsening, death and care requires constant monitoring of vital signs, hemodynamics,respiratory  and cardiac monitoring. I spent 35  minutes of neurocritical care time performing neurological assessment, discussion with family, other specialists and medical decision making of high complexityin the care of  this patient.

## 2017-11-05 NOTE — Progress Notes (Signed)
Unable to do vent check at this time due to significant amount of family in the room mourning over their family member. Given time for the family to grief.  RT will monitor pt clinical status.

## 2017-11-05 NOTE — Progress Notes (Signed)
   Brief Signoff Note.  Cannot see the patient today..  Did not tolerate the last night.  Is back on the 10.  Neurology this morning check for signs of baseline brain death.  Apparently, he is failed tests for viability.  Failed short-term waking up IABP this morning notable hypotension.  Since transplant services been contacted, we will keep open pump at 1-1 along with Levophed until such time as needed to discontinue care.  We will hold off on pulled on the note today based on current events but will be available if necessary.  Bryan Lemma, MD

## 2017-11-05 NOTE — Progress Notes (Signed)
ANTICOAGULATION CONSULT NOTE - Follow Up Consult  Pharmacy Consult for heparin Indication: IABP  Labs: Recent Labs    11-19-2017 1903  11-19-2017 2333 11/03/17 0453  11/04/17 0438 11/04/17 0908 11/04/17 1403 11/05/17 0034 11/05/17 0500 11/05/17 0646  HGB 14.9   < > 13.2 13.5  --  12.5*  --   --   --  11.3*  --   HCT 44.8   < > 39.1 40.5  --  35.7*  --   --   --  32.6*  --   PLT 228  --  226 187  --  85*  --   --   --  68*  --   APTT 35  --   --   --   --   --   --   --   --   --   --   LABPROT 17.5*  --  21.5*  --   --  28.8*  --   --   --  22.2*  --   INR 1.45  --  1.89  --   --  2.74  --   --   --  1.96  --   HEPARINUNFRC  --   --   --   --    < > <0.10*  --  0.16* 0.31 0.38  --   CREATININE 1.65*   < > 1.95* 2.13*  --   --  5.12*  --   --   --  7.74*  TROPONINI 39.49*  --  >65.00* >65.00*  --   --   --   --   --   --   --    < > = values in this interval not displayed.    Assessment/Plan:  52yo male who remains therapeutic with heparin level of 0.38 on a rate of 1200 units/hr. Renal function continues to decline, CBC stable, no signs/sx bleeding documented.  Goal: Heparin level (0.2-0.5) Monitor PLTs per protocol  Plan: Continue heparin 1200 units/hr Daily heparin level, CBC Monitor clinical course, s/sx bleeding  Donnella Bi, PharmD PGY1 Acute Care Pharmacy Resident Pager: [phone] 11/05/2017,8:51 AM

## 2017-11-05 NOTE — Progress Notes (Signed)
This note also relates to the following rows which could not be included: Temp - Cannot attach notes to unvalidated device data MAP (mmHg) - Cannot attach notes to unvalidated device data Pulse Rate - Cannot attach notes to unvalidated device data ECG Heart Rate - Cannot attach notes to unvalidated device data Resp - Cannot attach notes to unvalidated device data SpO2 - Cannot attach notes to unvalidated device data Arterial Line BP - Cannot attach notes to unvalidated device data Arterial Line MAP (mmHg) - Cannot attach notes to unvalidated device data Arterial Line BP 2 - Cannot attach notes to unvalidated device data Arterial Line MAP (mmHg) - Cannot attach notes to unvalidated device data PAP - Cannot attach notes to unvalidated device data PAP (Mean) - Cannot attach notes to unvalidated device data

## 2017-11-05 NOTE — Progress Notes (Signed)
ANTICOAGULATION CONSULT NOTE - Follow Up Consult  Pharmacy Consult for heparin Indication: IABP  Labs: Recent Labs    10/20/2017 1903  10/29/2017 2333 11/03/17 0453  11/04/17 0438 11/04/17 0908 11/04/17 1403 11/05/17 0034  HGB 14.9   < > 13.2 13.5  --  12.5*  --   --   --   HCT 44.8   < > 39.1 40.5  --  35.7*  --   --   --   PLT 228  --  226 187  --  85*  --   --   --   APTT 35  --   --   --   --   --   --   --   --   LABPROT 17.5*  --  21.5*  --   --  28.8*  --   --   --   INR 1.45  --  1.89  --   --  2.74  --   --   --   HEPARINUNFRC  --   --   --   --    < > <0.10*  --  0.16* 0.31  CREATININE 1.65*   < > 1.95* 2.13*  --   --  5.12*  --   --   TROPONINI 39.49*  --  >65.00* >65.00*  --   --   --   --   --    < > = values in this interval not displayed.    Assessment/Plan:  52yo male therapeutic on heparin after rate change. Will continue gtt at current rate and confirm stable with additional level.   Vernard Gambles, PharmD, BCPS  11/05/2017,1:15 AM

## 2017-11-05 NOTE — Progress Notes (Signed)
PULMONARY / CRITICAL CARE MEDICINE   Name: Scott Mcclure MRN: [id number] DOB: September 29, 1965    ADMISSION DATE:  10/21/2017 CONSULTATION DATE: 10/29/2017  REFERRING MD: Dr Eldridge Dace  CHIEF COMPLAINT: STEMI, Cardiac arrest   HISTORY OF PRESENT ILLNESS:   51yoM with hx Tobacco abuse, presented after having a witnessed out-of-hospital cardiac arrest. According to family, patient has been c/o CP for the past 1 year. Then tonight he had a witnessed cardiac arrest, with bystander CPR initiated. EMS was called and on their arrival found patient to be in VFib. He required 40 minutes of CPR prior to achieving ROSC. On arrival to the ER patient had again lost a pulse, this time with VT cardiac arrest. He was shocked multiple times on his way to the cath lab. In the cath lab patient was noted to be having decorticate type posturing of his arms. During the cath patient began having copious bleeding from his ETT. Cath found diffuse coronary lesions, most severe in RCA, requiring multiple stents. IABP and Theone Murdoch placed. On arrival from cath lab to ICU patient noted to have eyes rolled up in his head and having questionable seizure vs myoclonus activity in his shoulders. Neurology called and is at bedside now.   5/14 The patient is is presently in the ICU. He is on the ventialtor. He has a IABP in place with augmnented pressures of 100-110. The patient is requiring high doses of vasopressors. EEG done overnight suggested significant brain injury. The patient has a small left chest tube as he developed a PTX overnight. He has a Swan ganz catheter in the right groin.  5/16 The patient remains sedated tioin the ICU on the ventialtor. His pressor requirements have gone down and hsi cardiac putput up. He still has poor urine output. He does not move. He does trigger the ventialtor. The significant acidosis appears to have resolved.Will stop the bicarb infusions.  5/17 The  patient remains on the ventilator.  His  neurological status has worsened. He is no longer breathing over the vent His cardiac parameters have improved and pressor requirements lessened. The patient has essentially no uriine output and creatine has risen to > 7. We discussed the situation with neurolgys this AM and have decided to do an apnea test.  PAST MEDICAL HISTORY :  He  has a past medical history of Cardiac arrest (HCC) (11/05/2017) and Myocardial infarct (HCC) (11/05/2017).  PAST SURGICAL HISTORY: He  has a past surgical history that includes Coronary/Graft Acute MI Revascularization (N/A, 11/15/2017); LEFT HEART CATH AND CORONARY ANGIOGRAPHY (N/A, 10/27/2017); IABP Insertion (N/A, 11/03/2017); and RIGHT HEART CATH (N/A, 10/23/2017).    No Known Allergies  No current facility-administered medications on file prior to encounter.    No current outpatient medications on file prior to encounter.   FAMILY HISTORY:  His has no family status information on file.   SOCIAL HISTORY: He  reports that he has been smoking cigarettes.  He has been smoking about 1.00 pack per day. He has never used smokeless tobacco.is an active smoker. Per patient's mother and brother, he smokes marijuana but no other illicit drugs. No Etoh use. Patient lives with his mother in her house; a friend also lives in the house with the patient's mother.      VITAL SIGNS: BP 118/75   Pulse 81   Temp (!) 97.2 F (36.2 C) (Axillary)   Resp (!) 0   Ht 5\' 6"  (1.676 m)   Wt 162 lb 11.2 oz (73.8 kg)  SpO2 100%   BMI 26.26 kg/m   HEMODYNAMICS: PAP: (20-37)/(11-24) 35/16 CVP:  [6 mmHg-17 mmHg] 10 mmHg PCWP:  [10 mmHg-11 mmHg] 11 mmHg CO:  [3.2 L/min-4.7 L/min] 3.2 L/min CI:  [1.8 L/min/m2-2.6 L/min/m2] 1.8 L/min/m2 SBP 120's on no vasopressors  VENTILATOR SETTINGS: Vent Mode: PCV FiO2 (%):  [60 %] 60 % Set Rate:  [12 bmp] 12 bmp Vt Set:  [161 mL] 620 mL PEEP:  [8 cmH20] 8 cmH20 Plateau Pressure:  [17 cmH20-18 cmH20] 18 cmH20  INTAKE / OUTPUT: I/O  last 3 completed shifts: In: 3606.2 [I.V.:3476.2; NG/GT:30; IV Piggyback:100] Out: 163 [Urine:133; Chest Tube:30]  PHYSICAL EXAMINATION: General: Thin adult male, intubated and sedated, critically ill Neuro: pupils pinpoint, eyes both deviated upward, no response to sternal rub or nail bed pressure. No spontaneous movements of any extremities at time of my exam. Intermittent muscle contractures in neck and shoulders that are occurring at regular intervals (?seizure). Reportedly had decorticate movements earlier this evening prior to cath. HEENT: Orally intubated. ETT with copious bright red blood being suctioned Cardiovascular: RRR no m/r/g Lungs: Rhonchi bilaterally, pectus excavatum chest wall deformity, Abrasions to mid-sternum from CPR.  Abdomen: Soft NTND Musculoskeletal: no LE edema  Skin: abrasions to sternal as described above.   LABS:  BMET Recent Labs  Lab 11/03/17 0453 11/04/17 0908 11/05/17 0646  NA 141 136 135  K 4.1 3.7 4.8  CL 105 90* 89*  CO2 16* 27 26  BUN 18 46* 79*  CREATININE 2.13* 5.12* 7.74*  GLUCOSE 160* 108* 122*   Electrolytes Recent Labs  Lab 26-Nov-2017 2333 11/03/17 0453 11/04/17 0908 11/05/17 0646  CALCIUM 7.4* 7.4* 6.8* 6.0*  MG 1.8 1.8  --   --   PHOS 4.3 4.6  --   --    CBC Recent Labs  Lab 11/03/17 0453 11/04/17 0438 11/05/17 0500  WBC 39.0* 28.8* 19.7*  HGB 13.5 12.5* 11.3*  HCT 40.5 35.7* 32.6*  PLT 187 85* 68*   Coag's Recent Labs  Lab November 26, 2017 1903 2017-11-26 2333 11/04/17 0438 11/05/17 0500  APTT 35  --   --   --   INR 1.45 1.89 2.74 1.96   Sepsis Markers Recent Labs  Lab 11/26/17 2333 11/03/17 1056 11/03/17 1540 11/04/17 0438 11/04/17 0758  LATICACIDVEN 8.4*  --  14.1*  --  2.3*  PROCALCITON 2.59 13.86  --  29.15  --     ABG Recent Labs  Lab 11/05/17 0603 11/05/17 0945 11/05/17 0959  PHART 7.366 7.349* 7.161*  PCO2ART 50.3* 54.5* 87.7*  PO2ART 210.0* 433.0* 360.0*    Liver Enzymes Recent Labs  Lab  26-Nov-2017 1903 11-26-17 2333 11/03/17 0453  AST 294* 582* 929*  ALT 134* 148* 312*  ALKPHOS 67 52 52  BILITOT 1.5* 1.7* 2.4*  ALBUMIN 3.3* 2.9* 2.8*    Cardiac Enzymes Recent Labs  Lab 11/26/17 1903 11/26/2017 2333 11/03/17 0453  TROPONINI 39.49* >65.00* >65.00*   Glucose Recent Labs  Lab 11/04/17 1126 11/04/17 1508 11/04/17 1945 11/05/17 0020 11/05/17 0444 11/05/17 0827  GLUCAP 94 103* 104* 137* 121* 104*    Imaging Dg Chest Port 1 View  Result Date: 11/04/2017 CLINICAL DATA:  Intubated EXAM: PORTABLE CHEST 1 VIEW COMPARISON:  11/04/2017, 11/03/2017, 2017/11/26 FINDINGS: Endotracheal tube tip is about 2.2 cm superior to the carina. Esophageal tube tip projects beneath the left diaphragm. Left-sided chest tube over the left apex. Trace residual left apical pneumothorax. Ascending Swan-Ganz catheter tip over descending left pulmonary artery. Hazy right greater  than left ground-glass opacity. Repositioned intra aortic balloon pump with marker at the aortic arch. IMPRESSION: 1. Endotracheal tube tip about 2.2 cm superior to carina. Repositioning of intra aortic balloon pump with radiopaque marker at the aortic arch. Swan-Ganz catheter tip projects over descending left pulmonary artery 2. No significant change in ground-glass edema or infiltrates in the right greater than left lung 3. Suspect that there may be a tiny residual left apical pneumothorax. Electronically Signed   By: Jasmine Pang M.D.   On: 11/04/2017 21:38    STUDIES:  CXR (5/14): diffuse bilateral pulmonary infiltrates; moderate to large left-sided pneumothorax.  Head CT (5/14): pending EEG (5/14): pending   CULTURES: Sputum culture (5/14): pending   ANTIBIOTICS: Zosyn 5/14 >>  SIGNIFICANT EVENTS: 5/14: presented to ER s/p out-of-hospital VF/VT cardiac arrest, taken to cath lab with multiple DES placed, significant bleeding from ETT so not cooled, question of seizure. Neurology consulted. Left-sided  pneumothorax presumably related to CPR; chest tube placed.   LINES/TUBES: IABP 5/14 >> Swan Ganz catheter 5/14 >> LLE IO 5/14 >> ETT 5/14 >> NG tube 5/14 >>  DISCUSSION: 51yoM with hx Tobacco abuse, presented to ER s/p out-of-hospital VF/VT cardiac arrest, taken to cath lab with multiple DES placed, significant bleeding from ETT so not cooled, question of seizure. Neurology consulted. Left-sided pneumothorax presumably related to CPR; chest tube placed.  ASSESSMENT / PLAN:   PULMONARY 1. Acute hypoxic and hypercapneic respiratory failure; Pulmonary hemorrhage; Pulmonary infiltrates (aspiration pneumonia vs aspirated blood); Pneumothorax: - CXR on my review shows a moderate-to-large left-sided pneumothorax, for which a left-sided pigtail chest tube was emergently placed. Repeat CXR on my review shows left lung is now re-inflated with Chest tube in appropriate position. Chest tube placed to suction.   - ABG (prior to chest tube) showed combination metabolic and respiratory acidosis. Patient at that time was on AC/PC setting but was breathing at RR 35, well over set rate, with minute ventilation of 23. Gave 2 amps Bicarb.  - Repeat ABG now post-chest tube placement; place A-line.  - CXR also shows diffuse pulmonary infiltrates bilaterally. Unclear if this represents aspirated blood in the setting of acute pulmonary hemorrhage (suctioning copious frank blood from ETT) versus if this is aspiration pneumonia.   5/14 The patient is currently requiring 70% FI02. He is on Pressure control Ventialtion with an Inspiratory pressure of 10 and PEEP of 8.He is no longer having copious blood from the ET tube. The CXR shows  A right mid lung density and perhaps congestive changes bilaterally. The left lung has reexpanded post-CT placement. 5/15 ABG shows abit of a metabolic alkalois so we will stop the bicarb infusion. The patient can likely have his PEEP and FI02 taken down as he has an 02 sat of 100%  presently on those settings. 5/17 The patient remains on about 60% Fi02. As noted he is no longer assisting the ventialtor. ABG sows a moderarte chronic resp. acidosis   CARDIOVASCULAR 1. 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 Multiple DES's placed to RCA where heavier disease burden lay.  - IABP and Swan Ganz placed.  - TTE ordered. - Not initiating hypothermia protocol given the large volume of frank blood being suctioned from ETT. - Continue Cangrelor for 8 hours post Brilinta 180 mg via OG tube per Cardiology rec's - Start Statin 5/14 The patient is requiring large doses of pressors. He is on Vasopressin of  o.o3 units/minand over 30 mcg of levophed. His recent PCWP awas aboput 14. His cardiac output was about 2.2 This does not augus well for the patient The Ernestine Conrad ganz tip is at the level of thel eft pA. The tip of the balloon pump appears to be in proper position. 5/16  No majorarrhythmias nnoted. Cardiac fn. Remarkably better with new CI about 2.4/. The ppressor  requirents have bee tapered as well. The aotent remains on about 20 mcg of levophed, no vasoporessin and was just taken off Epinephrine. Cardiology had request a small dose of dobutamine at this time. The patient remains on the IABP at 1:1. Peak tropopnin was > 65 on 5.15. CXR is pending 5/17 ;Last cardiac index about 1.8 As noted pressor requirements are lower. He is off dobutrex that apparently led to tachycardia. The IABP  Is still in. It is running 1:1 and augmented systolic pressures are 100-110. The patient is on an amiodarone drip but not having significant ectopy. The patient is on 10 mcg/min Levophed. RENAL 1. AKI: - creatinine 1.65 with no known prior baseline; presumed this is all acute injury, likely related to ATN in setting of the prolonged arrest - foley catheter placed; UA obtained. Add urine lytes as  well.  - monitor UOP; avoid nephrotoxic agents. 5/14 Urine output appears to be poor as expected given the profound shock. There does not appear to be an immminent need now for RRT. 5/17 Hios renal fn is much worse . Creatine isa now over 7. He is eesentially anuric.    2. Hypokalemia: - K 3.5; replete with KCL IV  GASTROINTESTINAL 1. Shock liver:  - transaminitis likely related to shock liver from the cardiac arrest. Continue to trend LFT's daily. - NPO; GI prophylaxis.   HEMATOLOGIC No active issues   INFECTIOUS 1. Aspiration pneumonia: - check sputum culture, procal, lactate; start Zosyn  ENDOCRINE 1. Hyperglycemia: - no hx of DM; NPO; SSI  NEUROLOGIC 1. Questionable seizure vs Myoclonus; Anoxic Encephalopathy: - Neurology consulted - EEG ordered - Anti-epileptic drugs per Neurology.  I discussed the case with neurology. The aotient has some anisocoria tod ay. The left pupil is about 29mm and theright 1.5 whic his new. The aoptient does not arouse or move. Neurology is oconcerned because even on their initial exam there appeared to be evidence of brainstem dysfn, clinical myoclonus and burst suppressionof AEDS. While it would be nice to image the patient we do niot want to move himpresently out of the unit. 5/17 It appears that his neuro status has gotten much worse. The patient just had an apnea test and he did not breath during the test and it was a giod test. The only factor complication the test is his very abnormal. 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 I will discuss with neurology and than we will have to meet with his family. The patient is clearly not going to recover neurologically.  FAMILY  - Updates: updated patient's mother and brother at bedside.  - Inter-disciplinary family meet or Palliative Care meeting due by: 11/08/17  Criticalcare time 45 minutes   Jamesetta So MD [place] [facility] critical[facility] 11/05/2017, 10:19 AM

## 2017-11-05 NOTE — Progress Notes (Signed)
eLink Physician-Brief Progress Note Patient Name: Scott Mcclure DOB: Sep 07, 1965 MRN: [id number]   Date of Service  11/05/2017  HPI/Events of Note  Brief discussion with Tempie Donning, patient's mother regarding the families desire to withdraw care.  There have been several discussions during the day with neurology and PCCM regarding the patient's lack of neurologic recovery.  The family has decided to withdraw care at this time.  eICU Interventions  Withdrawal of life-sustaining treatment orders placed.  Communicated with bedside nurse     Intervention Category Major Interventions: End of life / care limitation discussion  DETERDING,ELIZABETH 11/05/2017, 11:30 PM

## 2017-11-05 NOTE — Progress Notes (Signed)
Family meeting note  Met with patient's mother, brother, niece and another family member in the consultation room. I explained to them the poor clinical signs on exam, poor imaging signs and poor EEG findings that he came with along with a complicated multiorgan failure. This makes a neurologically meaningful recovery very unlikely. The family had another child who required constant attention.  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 They are inclined towards withdrawing care and waiting for a few family members to come in from [place]. I have communicated this plan to the patient's nurse. Neurology will be available as needed. Please call with questions   -- Amie Portland, MD [facility] Pager: [phone] If 7pm to 7am, please call on call as listed on AMION.

## 2017-11-06 LAB — CULTURE, RESPIRATORY: CULTURE: NORMAL

## 2017-11-06 LAB — CULTURE, RESPIRATORY W GRAM STAIN

## 2017-11-06 LAB — GLUCOSE, CAPILLARY: Glucose-Capillary: 89 mg/dL (ref 65–99)

## 2017-11-12 ENCOUNTER — Telehealth: Payer: Self-pay

## 2017-11-12 NOTE — Telephone Encounter (Signed)
On 11/11/17 I received a d/c from [employer] (original). The d/c is for donation. The patient is a patient of Doctor Rosenbalt.   The d/c will be taken to Pulmonary Unit for signature.  On 11/12/17 I received the d/c back from Doctor Craige Cotta who signed the d/c for Doctor Rosenbalt.  I got the d/c ready and called the funeral home to let them know I mailed the d/c to vital records per the funeral home request.

## 2017-11-20 NOTE — Progress Notes (Signed)
80mg  of morphine iv bag wasted in the sink in the presence of Rn June smith

## 2017-11-20 NOTE — Procedures (Signed)
Extubation Procedure Note  Patient Details:   Name: Scott Mcclure DOB: 1965/11/13 MRN: [id number]   Airway Documentation:     Evaluation  O2 sats: stable throughout Complications: No apparent complications Patient did not tolerate procedure well. Bilateral Breath Sounds: Diminished   No- terminally extubated.   Pt was terminally extubated to room air per family wishes.    Berton Bon November 25, 2017, 12:11 AM

## 2017-11-20 NOTE — Progress Notes (Signed)
Patient was pronounced dead after Rn [facility] and Rn June smith auscultated no breath sounds and heart beat for over one minute. Family at bedside throughout withdrawal process. Md aware of passing.

## 2017-11-20 DEATH — deceased

## 2017-12-20 NOTE — Death Summary Note (Signed)
Scott Mcclure was a 52 y.o. male admitted on 11/07/2017 after collapsing at home.  Found to have ventricular tachycardia and ventricular fibrillation.  Had ROSC after 40 minutes.  ECG showed ST elevation and taken for emergent cardiac cath.  He had multiple stents placed.  He had recurrent cardiac arrest in cath lab.  He had decorticate posturing and myoclonic activity.  Neurology consulted.  He was started on AEDs.  He was noted of pneumothorax after CPR and chest tube placed.  Started on antibiotics for aspiration pneumonia.  His neurologic exam became worse.  Discussed with family, and opted for comfort measures.  He had vent removed on 11/10/2017 and expired.  Causes of death: Ventricular tachycardia/ventricular fibrillation cardiac arrest from STEMI s/p cardiac cath with stenting to RCA  Final diagnosis: Acute hypoxic, hypercapnic respiratory failure Left pneumothorax after CPR Cardiogenic shock s/p IABP placement Acute anoxic encephalopathy Aspiration pneumonia Acute renal failure with ATN Elevated liver enzymes from shock Thrombocytopenia Lactic acidosis  Coralyn Helling, MD [facility] Pulmonary/Critical Care 11/27/2017, 9:37 AM

## 2018-02-08 ENCOUNTER — Telehealth: Payer: Self-pay | Admitting: Pulmonary Disease

## 2018-02-08 NOTE — Telephone Encounter (Addendum)
This form has been received by [facility] per [facility].  Form was refaxed to our office this morning. This has been placed in [facility] look at.

## 2018-02-09 NOTE — Telephone Encounter (Signed)
Attempted to call Scott Mcclure. I did not receive an answer. I have left a message for Scott Mcclure to return our call.

## 2018-02-09 NOTE — Telephone Encounter (Signed)
Spoke with Victorino Dike, she did receive form. Nothing further is needed.

## 2018-02-09 NOTE — Telephone Encounter (Signed)
Dr. Malachi Carl did not want to sign form.    Dr. Craige Cotta signed form instead, and gave to CMA earlier this week to be faxed back.

## 2018-02-09 NOTE — Telephone Encounter (Signed)
Eileen Stanford returning call and can be reached @ [phone]...sad

## 2019-09-26 IMAGING — CT CT HEAD W/O CM
4 series · 16 of 47 positions shown, 18 images · non-contrast
Comparison: None.

CLINICAL DATA: 51 y/o M; altered level of consciousness. History of
MI and cardiac arrest.

EXAM:
CT HEAD WITHOUT CONTRAST
TECHNIQUE: Contiguous axial images were obtained from the base of the skull
through the vertex without intravenous contrast.

[Series 3: head without · axial · non-contrast · 0.41mm/px · z∈[-108,+32]mm · 7 of 38 slices shown, 9 images]
[im 5/38  brain]
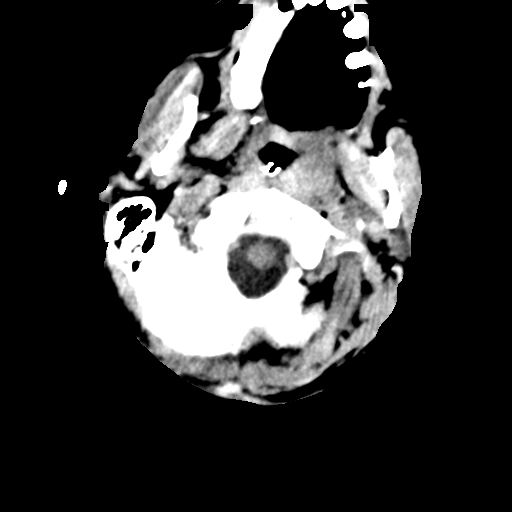
[im 5/38  bone]
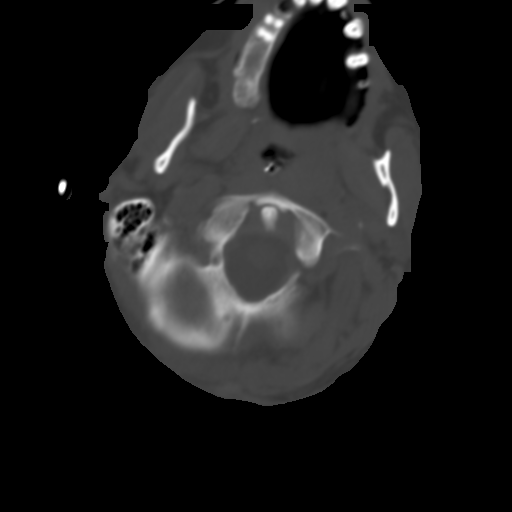
[im 10/38  brain]
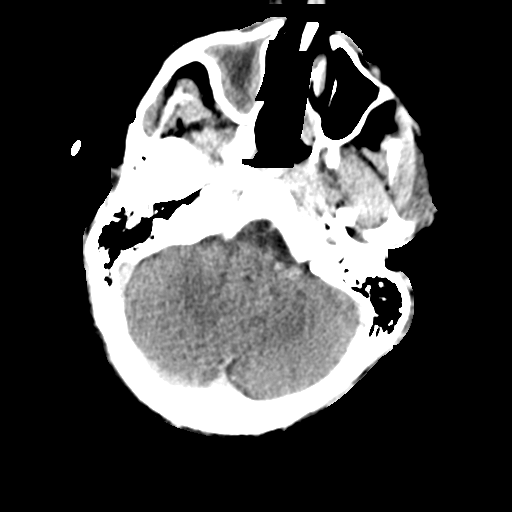
[im 14/38  brain]
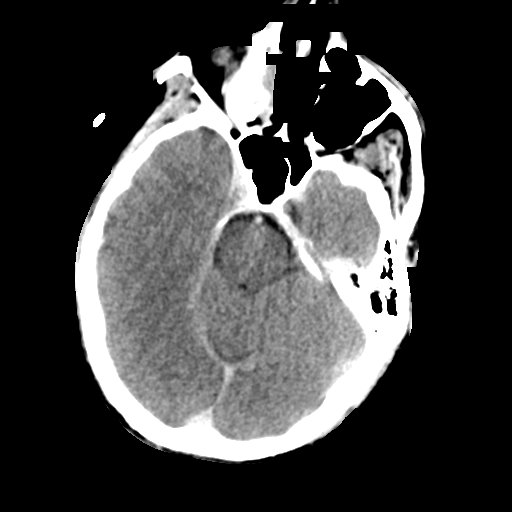
[im 19/38  brain]
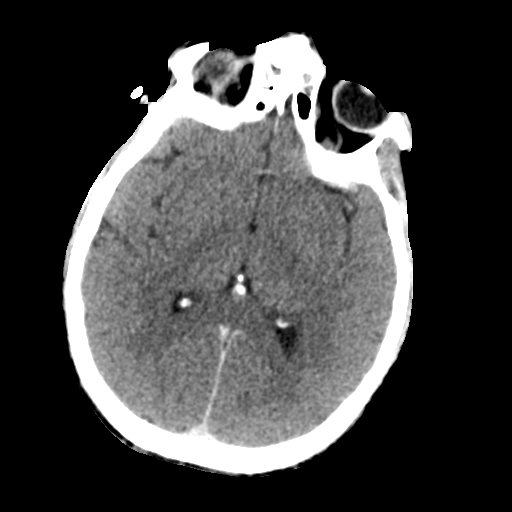
[im 24/38  brain]
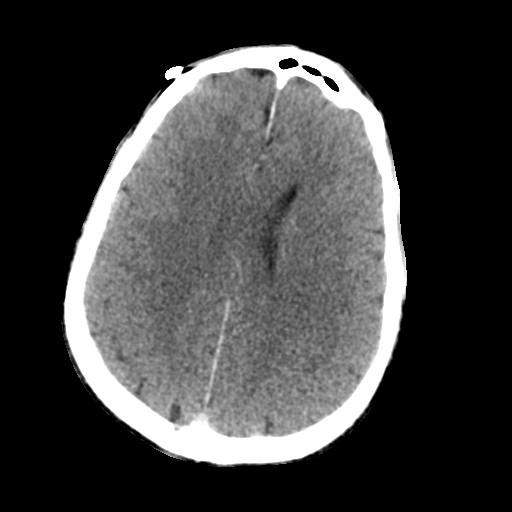
[im 24/38  bone]
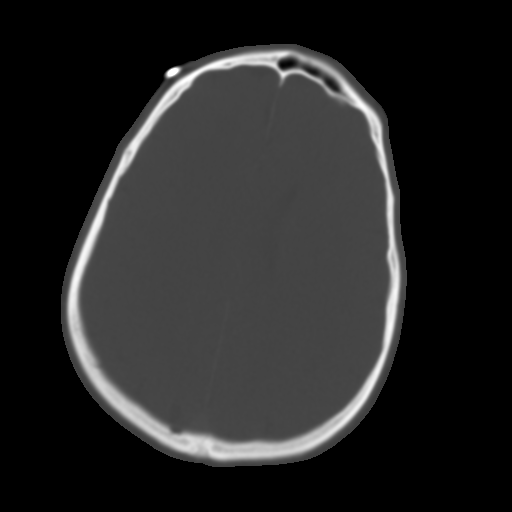
[im 28/38  brain]
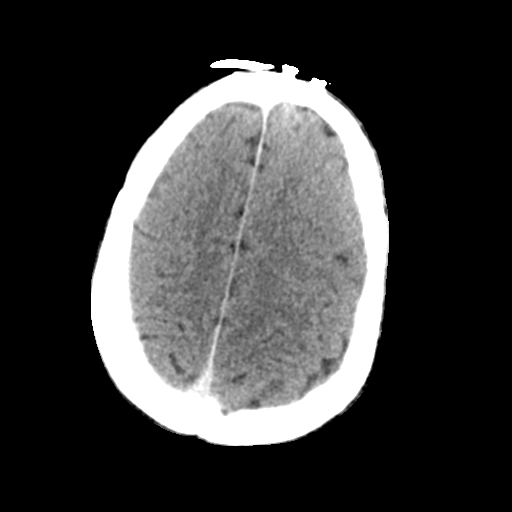
[im 33/38  brain]
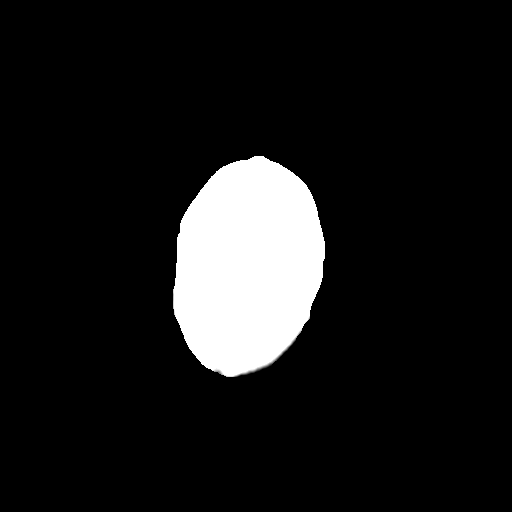

[Series 4: head bone · axial · 0.41mm/px · z∈[-110,-74]mm · 3 of 94 slices shown]
[im 10/94  bone]
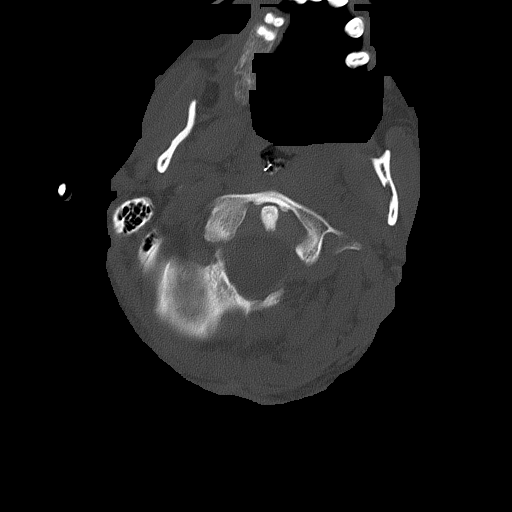
[im 19/94  bone]
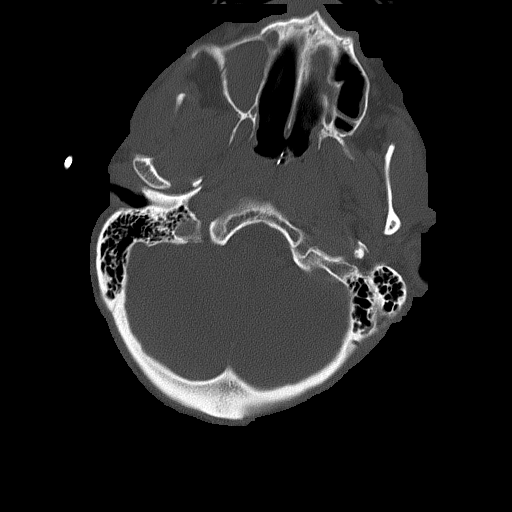
[im 28/94  bone]
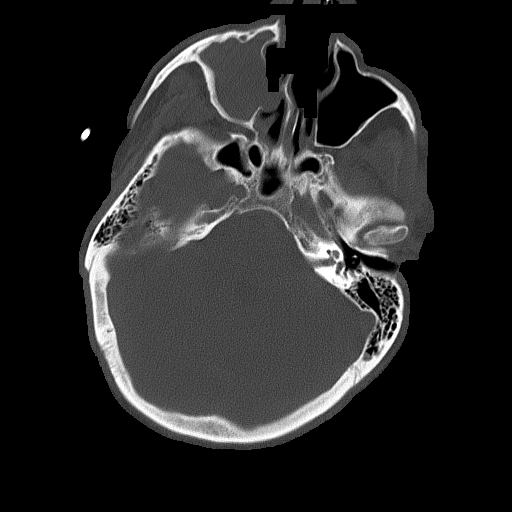

[Series 5: head without cor · coronal · non-contrast · 0.35mm/px · 3 of 71 slices shown]
[im 24/71  brain]
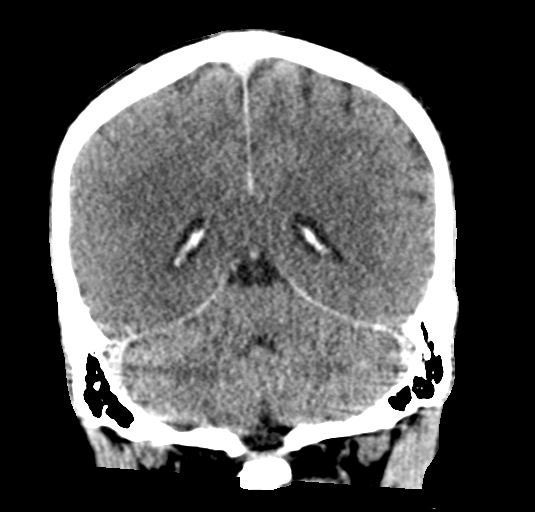
[im 32/71  brain]
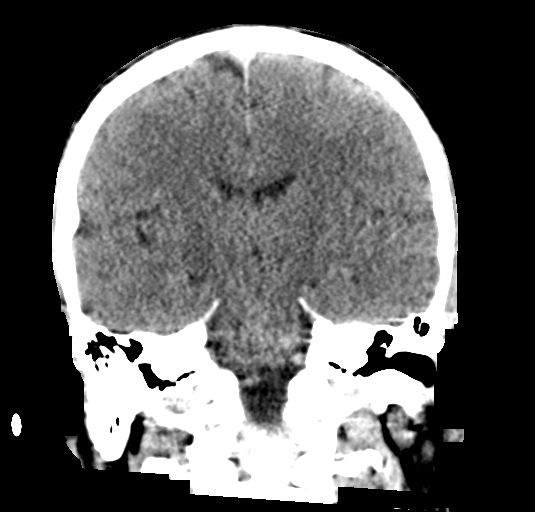
[im 39/71  brain]
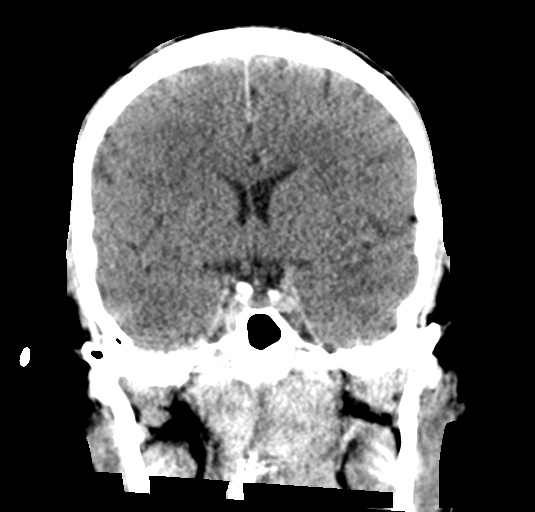

[Series 6: head without sag · sagittal · non-contrast · 0.34mm/px · 3 of 64 slices shown]
[im 22/64  brain]
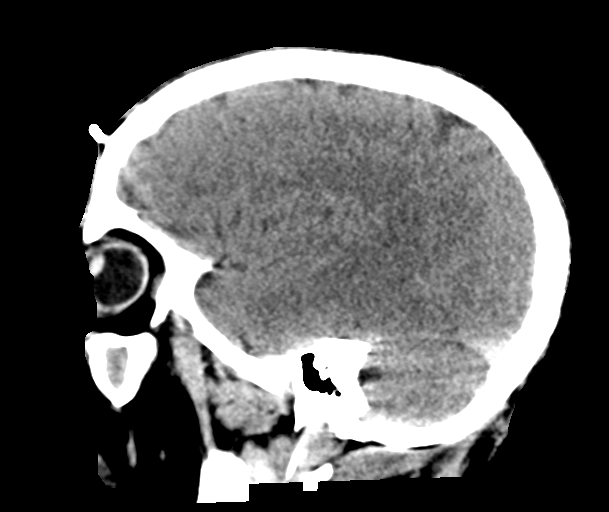
[im 32/64  brain]
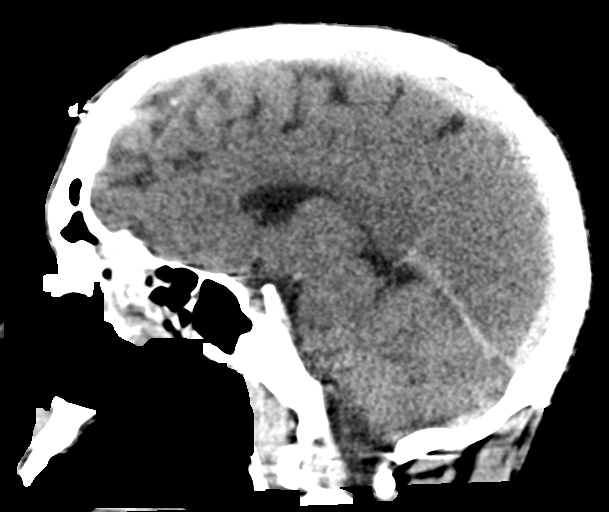
[im 43/64  brain]
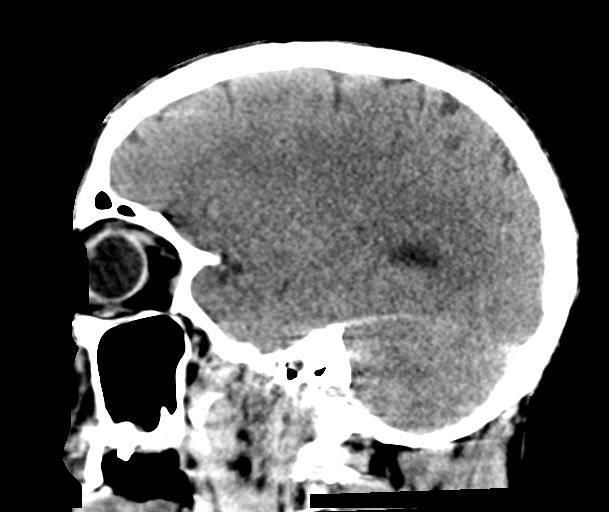

[16 of 47 positions shown; findings below may reference images not displayed]

FINDINGS: Brain: There is subtle blurring of gray-white differentiation and
greater than expected sulcal effacement for age. Findings may
represent early hypoxic ischemic injury. Follow-up CT or MRI of the
brain is recommended to assess for interval change. No acute
intracranial hemorrhage, focal mass effect, or herniation.

Vascular: No hyperdense vessel or unexpected calcification.

Skull: There are multiple sessile scalp lesions at the vertex and in
the parietal regions. No acute osseous abnormality identified.

Sinuses/Orbits: Opacification the right maxillary sinus with
widening of the right ostiomeatal unit which may represent
underlying polyp or mucocele. Patchy opacification of anterior
ethmoid air cells and the right frontal sinus. Chronic inflammatory
changes of the walls of right maxillary sinus. Normal aeration of
mastoid air cells. Orbits are unremarkable. Nasoenteric and
endotracheal tubes.

Other: None.
IMPRESSION: 1. Subtle blurring of gray-white differentiation and greater than
expected sulcal effacement for age. Findings may represent early
hypoxic ischemic injury. Consider follow-up CT or MRI of the brain
to assess for interval change.
2. Right maxillary opacification and widening of the right
ostiomeatal unit may represent underlying polyp or mucocele. Direct
visualization recommended.

These results will be called to the ordering clinician or
representative by the Radiologist Assistant, and communication
documented in the PACS or zVision Dashboard.

By: Jibon Mane Stat M.D.

## 2019-09-27 IMAGING — DX DG CHEST 1V PORT
1 series · 1 of 1 positions shown · non-contrast
Comparison: 11/03/2017

CLINICAL DATA: Respiratory failure.  Cardiac arrest.

EXAM:
PORTABLE CHEST 1 VIEW

[chest ap]
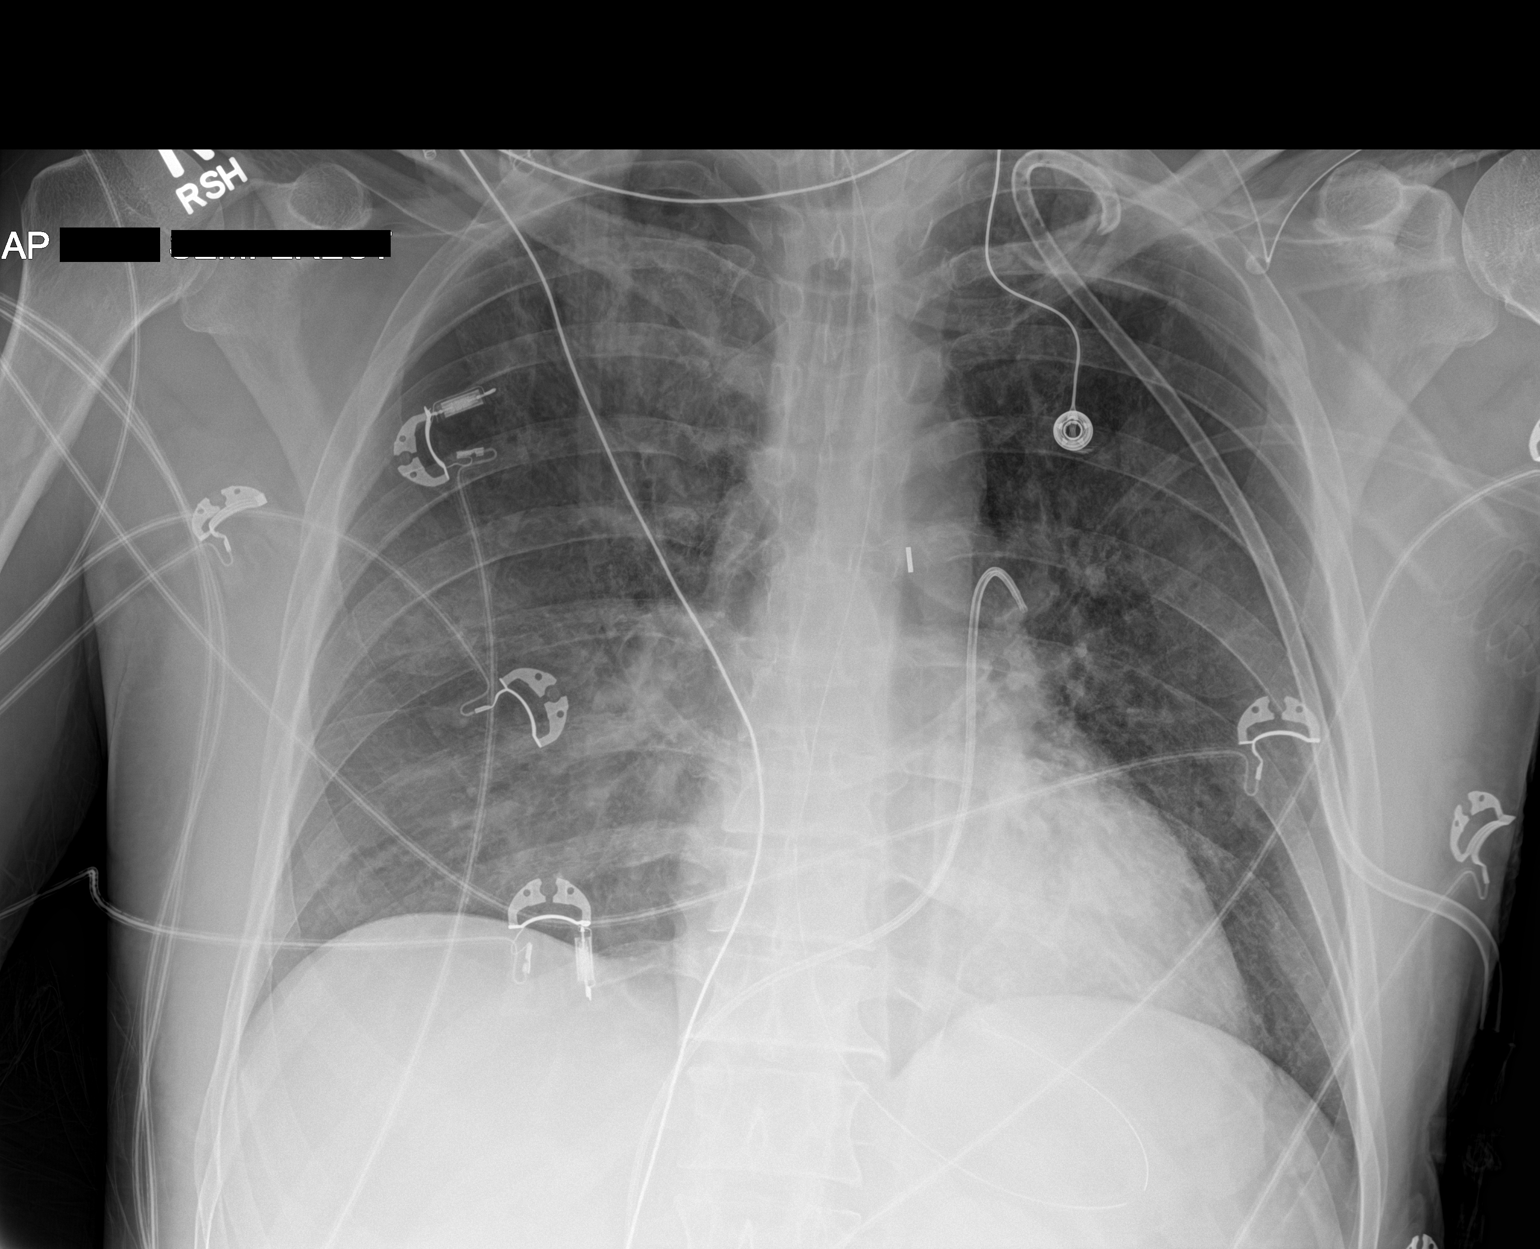

[1 of 1 positions shown; findings below may reference images not displayed]

FINDINGS: Endotracheal tube has been advanced and terminates 2.5 cm above the
carina. Enteric tube is partially folded on itself in the left upper
quadrant with tip likely in the gastric fundus. Intra-aortic balloon
pump marker projects over the proximal descending thoracic aorta at
the level of the carina, more inferiorly positioned than on the
prior study. An inferior approach Swan-Ganz catheter terminates over
the left main pulmonary artery. A left chest tube remains in place.
The cardiac silhouette is normal in size. Interstitial and alveolar
opacities in the right greater than left lungs have mildly improved.
No sizable pleural effusion or pneumothorax is identified.
IMPRESSION: 1. Support devices including repositioning of the endotracheal tube
and intra-aortic balloon pump as above.
2. Mild improvement of right greater than left lung opacities which
may reflect edema, pneumonia, or ARDS.
3. No pneumothorax.
